# Patient Record
Sex: Male | Born: 1940 | Race: Black or African American | Hispanic: No | Smoking: Never smoker
Health system: Southern US, Community
[De-identification: ages and names within clinical notes are randomized; demographics above are authoritative.]

## PROBLEM LIST (undated history)

## (undated) DIAGNOSIS — I10 Essential (primary) hypertension: Secondary | ICD-10-CM

## (undated) DIAGNOSIS — I2510 Atherosclerotic heart disease of native coronary artery without angina pectoris: Secondary | ICD-10-CM

## (undated) DIAGNOSIS — E119 Type 2 diabetes mellitus without complications: Secondary | ICD-10-CM

## (undated) HISTORY — PX: CARDIAC SURGERY: SHX584

## (undated) HISTORY — PX: CARPAL TUNNEL RELEASE: SHX101

---

## 2009-01-15 DIAGNOSIS — E669 Obesity, unspecified: Secondary | ICD-10-CM | POA: Insufficient documentation

## 2009-01-15 DIAGNOSIS — I2510 Atherosclerotic heart disease of native coronary artery without angina pectoris: Secondary | ICD-10-CM | POA: Insufficient documentation

## 2009-01-15 DIAGNOSIS — E119 Type 2 diabetes mellitus without complications: Secondary | ICD-10-CM | POA: Insufficient documentation

## 2012-09-19 DIAGNOSIS — H40009 Preglaucoma, unspecified, unspecified eye: Secondary | ICD-10-CM | POA: Insufficient documentation

## 2012-09-19 DIAGNOSIS — Z961 Presence of intraocular lens: Secondary | ICD-10-CM | POA: Insufficient documentation

## 2016-02-25 ENCOUNTER — Encounter (HOSPITAL_COMMUNITY): Payer: Self-pay

## 2016-02-25 ENCOUNTER — Emergency Department (HOSPITAL_COMMUNITY): Payer: Medicare PPO

## 2016-02-25 ENCOUNTER — Observation Stay (HOSPITAL_COMMUNITY)
Admission: EM | Admit: 2016-02-25 | Discharge: 2016-02-26 | Disposition: A | Discharge status: Home / Self Care | Payer: Medicare PPO | Attending: Internal Medicine | Admitting: Internal Medicine

## 2016-02-25 DIAGNOSIS — R449 Unspecified symptoms and signs involving general sensations and perceptions: Secondary | ICD-10-CM

## 2016-02-25 DIAGNOSIS — Z794 Long term (current) use of insulin: Secondary | ICD-10-CM | POA: Diagnosis not present

## 2016-02-25 DIAGNOSIS — I10 Essential (primary) hypertension: Secondary | ICD-10-CM | POA: Diagnosis not present

## 2016-02-25 DIAGNOSIS — E785 Hyperlipidemia, unspecified: Secondary | ICD-10-CM

## 2016-02-25 DIAGNOSIS — R200 Anesthesia of skin: Secondary | ICD-10-CM | POA: Diagnosis not present

## 2016-02-25 DIAGNOSIS — I25810 Atherosclerosis of coronary artery bypass graft(s) without angina pectoris: Secondary | ICD-10-CM

## 2016-02-25 DIAGNOSIS — I2510 Atherosclerotic heart disease of native coronary artery without angina pectoris: Secondary | ICD-10-CM | POA: Diagnosis not present

## 2016-02-25 DIAGNOSIS — Z7982 Long term (current) use of aspirin: Secondary | ICD-10-CM | POA: Insufficient documentation

## 2016-02-25 DIAGNOSIS — E1140 Type 2 diabetes mellitus with diabetic neuropathy, unspecified: Secondary | ICD-10-CM

## 2016-02-25 DIAGNOSIS — Z7984 Long term (current) use of oral hypoglycemic drugs: Secondary | ICD-10-CM | POA: Diagnosis not present

## 2016-02-25 DIAGNOSIS — Z79899 Other long term (current) drug therapy: Secondary | ICD-10-CM | POA: Diagnosis not present

## 2016-02-25 DIAGNOSIS — R55 Syncope and collapse: Principal | ICD-10-CM | POA: Insufficient documentation

## 2016-02-25 DIAGNOSIS — Z880 Allergy status to penicillin: Secondary | ICD-10-CM | POA: Insufficient documentation

## 2016-02-25 DIAGNOSIS — Z7902 Long term (current) use of antithrombotics/antiplatelets: Secondary | ICD-10-CM | POA: Diagnosis not present

## 2016-02-25 DIAGNOSIS — R4189 Other symptoms and signs involving cognitive functions and awareness: Secondary | ICD-10-CM | POA: Diagnosis not present

## 2016-02-25 DIAGNOSIS — M109 Gout, unspecified: Secondary | ICD-10-CM

## 2016-02-25 DIAGNOSIS — R42 Dizziness and giddiness: Secondary | ICD-10-CM | POA: Diagnosis not present

## 2016-02-25 DIAGNOSIS — R202 Paresthesia of skin: Secondary | ICD-10-CM | POA: Diagnosis not present

## 2016-02-25 DIAGNOSIS — G459 Transient cerebral ischemic attack, unspecified: Secondary | ICD-10-CM | POA: Diagnosis present

## 2016-02-25 DIAGNOSIS — E119 Type 2 diabetes mellitus without complications: Secondary | ICD-10-CM | POA: Diagnosis not present

## 2016-02-25 HISTORY — DX: Type 2 diabetes mellitus without complications: E11.9

## 2016-02-25 HISTORY — DX: Atherosclerotic heart disease of native coronary artery without angina pectoris: I25.10

## 2016-02-25 HISTORY — DX: Essential (primary) hypertension: I10

## 2016-02-25 LAB — CBC
HEMATOCRIT: 35 % — AB (ref 39.0–52.0)
Hemoglobin: 11.7 g/dL — ABNORMAL LOW (ref 13.0–17.0)
MCH: 31.4 pg (ref 26.0–34.0)
MCHC: 33.4 g/dL (ref 30.0–36.0)
MCV: 93.8 fL (ref 78.0–100.0)
Platelets: 150 10*3/uL (ref 150–400)
RBC: 3.73 MIL/uL — ABNORMAL LOW (ref 4.22–5.81)
RDW: 13.5 % (ref 11.5–15.5)
WBC: 4.4 10*3/uL (ref 4.0–10.5)

## 2016-02-25 LAB — DIFFERENTIAL
BASOS ABS: 0 10*3/uL (ref 0.0–0.1)
BASOS PCT: 0 %
EOS ABS: 0.1 10*3/uL (ref 0.0–0.7)
Eosinophils Relative: 2 %
Lymphocytes Relative: 38 %
Lymphs Abs: 1.7 10*3/uL (ref 0.7–4.0)
MONOS PCT: 6 %
Monocytes Absolute: 0.3 10*3/uL (ref 0.1–1.0)
Neutro Abs: 2.3 10*3/uL (ref 1.7–7.7)
Neutrophils Relative %: 54 %

## 2016-02-25 LAB — COMPREHENSIVE METABOLIC PANEL
ALT: 11 U/L — ABNORMAL LOW (ref 17–63)
ANION GAP: 11 (ref 5–15)
AST: 20 U/L (ref 15–41)
Albumin: 3.6 g/dL (ref 3.5–5.0)
Alkaline Phosphatase: 51 U/L (ref 38–126)
BUN: 11 mg/dL (ref 6–20)
CHLORIDE: 104 mmol/L (ref 101–111)
CO2: 25 mmol/L (ref 22–32)
Calcium: 9.1 mg/dL (ref 8.9–10.3)
Creatinine, Ser: 1.1 mg/dL (ref 0.61–1.24)
Glucose, Bld: 204 mg/dL — ABNORMAL HIGH (ref 65–99)
POTASSIUM: 3.7 mmol/L (ref 3.5–5.1)
SODIUM: 140 mmol/L (ref 135–145)
Total Bilirubin: 0.4 mg/dL (ref 0.3–1.2)
Total Protein: 6.5 g/dL (ref 6.5–8.1)

## 2016-02-25 LAB — CREATININE, SERUM: CREATININE: 0.98 mg/dL (ref 0.61–1.24)

## 2016-02-25 LAB — I-STAT CHEM 8, ED
BUN: 15 mg/dL (ref 6–20)
CHLORIDE: 101 mmol/L (ref 101–111)
CREATININE: 1 mg/dL (ref 0.61–1.24)
Calcium, Ion: 1.13 mmol/L (ref 1.13–1.30)
Glucose, Bld: 195 mg/dL — ABNORMAL HIGH (ref 65–99)
HEMATOCRIT: 36 % — AB (ref 39.0–52.0)
HEMOGLOBIN: 12.2 g/dL — AB (ref 13.0–17.0)
POTASSIUM: 3.7 mmol/L (ref 3.5–5.1)
Sodium: 141 mmol/L (ref 135–145)
TCO2: 28 mmol/L (ref 0–100)

## 2016-02-25 LAB — APTT: APTT: 29 s (ref 24–37)

## 2016-02-25 LAB — PROTIME-INR
INR: 1.07 (ref 0.00–1.49)
Prothrombin Time: 14.1 seconds (ref 11.6–15.2)

## 2016-02-25 LAB — I-STAT TROPONIN, ED: TROPONIN I, POC: 0 ng/mL (ref 0.00–0.08)

## 2016-02-25 LAB — GLUCOSE, CAPILLARY: GLUCOSE-CAPILLARY: 114 mg/dL — AB (ref 65–99)

## 2016-02-25 MED ORDER — INSULIN GLARGINE 100 UNIT/ML ~~LOC~~ SOLN
20.0000 [IU] | Freq: Every day | SUBCUTANEOUS | Status: DC
  Administered 2016-02-26: 20 [IU] via SUBCUTANEOUS
  Filled 2016-02-25: qty 0.2

## 2016-02-25 MED ORDER — ENOXAPARIN SODIUM 40 MG/0.4ML ~~LOC~~ SOLN
40.0000 mg | SUBCUTANEOUS | Status: DC
  Administered 2016-02-25: 40 mg via SUBCUTANEOUS
  Filled 2016-02-25: qty 0.4

## 2016-02-25 MED ORDER — ATORVASTATIN CALCIUM 80 MG PO TABS
80.0000 mg | ORAL_TABLET | Freq: Every day | ORAL | Status: DC
  Administered 2016-02-25: 80 mg via ORAL
  Filled 2016-02-25: qty 1

## 2016-02-25 MED ORDER — ASPIRIN 81 MG PO CHEW
81.0000 mg | CHEWABLE_TABLET | ORAL | Status: DC
  Administered 2016-02-26: 81 mg via ORAL
  Filled 2016-02-25: qty 1

## 2016-02-25 MED ORDER — PHENYLEPH-SHARK LIV OIL-MO-PET 0.25-3-14-71.9 % RE OINT
1.0000 "application " | TOPICAL_OINTMENT | Freq: Every day | RECTAL | Status: DC
  Filled 2016-02-25: qty 28.4

## 2016-02-25 MED ORDER — WITCH HAZEL-GLYCERIN EX PADS
MEDICATED_PAD | CUTANEOUS | Status: DC | PRN
  Filled 2016-02-25: qty 100

## 2016-02-25 MED ORDER — FINASTERIDE 5 MG PO TABS
5.0000 mg | ORAL_TABLET | Freq: Every day | ORAL | Status: DC
  Administered 2016-02-25: 5 mg via ORAL
  Filled 2016-02-25: qty 1

## 2016-02-25 MED ORDER — STROKE: EARLY STAGES OF RECOVERY BOOK
Freq: Once | Status: AC
  Administered 2016-02-25: 23:00:00
  Filled 2016-02-25: qty 1

## 2016-02-25 MED ORDER — INSULIN ASPART 100 UNIT/ML ~~LOC~~ SOLN: 0.0000 [IU] | Freq: Three times a day (TID) | SUBCUTANEOUS | Status: DC

## 2016-02-25 MED ORDER — CLOPIDOGREL BISULFATE 75 MG PO TABS
75.0000 mg | ORAL_TABLET | ORAL | Status: DC
  Administered 2016-02-26: 75 mg via ORAL
  Filled 2016-02-25: qty 1

## 2016-02-25 MED ORDER — DOXAZOSIN MESYLATE 2 MG PO TABS
2.0000 mg | ORAL_TABLET | Freq: Every day | ORAL | Status: DC
  Administered 2016-02-25: 2 mg via ORAL
  Filled 2016-02-25: qty 1

## 2016-02-25 MED ORDER — PANTOPRAZOLE SODIUM 40 MG PO TBEC
40.0000 mg | DELAYED_RELEASE_TABLET | Freq: Two times a day (BID) | ORAL | Status: DC
  Administered 2016-02-25 – 2016-02-26 (×2): 40 mg via ORAL
  Filled 2016-02-25 (×2): qty 1

## 2016-02-25 MED ORDER — INSULIN ASPART 100 UNIT/ML ~~LOC~~ SOLN: 0.0000 [IU] | Freq: Every day | SUBCUTANEOUS | Status: DC

## 2016-02-25 MED ORDER — HYDROCORTISONE 2.5 % RE CREA
TOPICAL_CREAM | Freq: Every day | RECTAL | Status: DC
  Administered 2016-02-25: 1 via RECTAL
  Filled 2016-02-25: qty 28.35

## 2016-02-25 MED ORDER — HYPROMELLOSE (GONIOSCOPIC) 2.5 % OP SOLN: 1.0000 [drp] | Freq: Every day | OPHTHALMIC | Status: DC | PRN

## 2016-02-25 MED ORDER — GABAPENTIN 400 MG PO CAPS
400.0000 mg | ORAL_CAPSULE | Freq: Every day | ORAL | Status: DC
  Administered 2016-02-25: 400 mg via ORAL
  Filled 2016-02-25: qty 1

## 2016-02-25 MED ORDER — ADULT MULTIVITAMIN W/MINERALS CH
1.0000 | ORAL_TABLET | Freq: Every day | ORAL | Status: DC
  Administered 2016-02-25 – 2016-02-26 (×2): 1 via ORAL
  Filled 2016-02-25 (×2): qty 1

## 2016-02-25 MED ORDER — TRAMADOL HCL 50 MG PO TABS
25.0000 mg | ORAL_TABLET | Freq: Every evening | ORAL | Status: DC | PRN
  Administered 2016-02-25 – 2016-02-26 (×2): 25 mg via ORAL
  Filled 2016-02-25 (×2): qty 1

## 2016-02-25 MED ORDER — ALLOPURINOL 300 MG PO TABS
300.0000 mg | ORAL_TABLET | Freq: Every day | ORAL | Status: DC
  Administered 2016-02-26: 300 mg via ORAL
  Filled 2016-02-25: qty 1

## 2016-02-25 NOTE — ED Notes (Signed)
Pt transported to CT and then MRI.

## 2016-02-25 NOTE — ED Notes (Signed)
Attempted to call report

## 2016-02-25 NOTE — ED Notes (Signed)
Family at bedside. 

## 2016-02-25 NOTE — ED Notes (Signed)
Pt. Back from MRI scan.

## 2016-02-25 NOTE — ED Provider Notes (Signed)
CSN: [id number]     Arrival date & time 02/25/16  1601 History   First MD Initiated Contact with Patient 02/25/16 1620     Chief Complaint  Patient presents with  . Code Stroke     (Consider location/radiation/quality/duration/timing/severity/associated sxs/prior Treatment) HPI 75 y.o. male with history of CAD status post CABG in 1996, diabetes and HTN presents to the emergency department after he had a brief episode of feeling flushed and lightheaded after he stepped out of a car up onto the sidewalk going to visit a friend. The patient then returned home where he was seen to have findings concerning for left-sided facial droop as well as subtly slurred speech the patient remained alert and oriented throughout. EMS was called and neuro exam at that time was reassuring with no facial droop noted. He presented to the ED as a code stroke and was seen concomitently with neurology on arrival.   Past Medical History  Diagnosis Date  . Diabetes mellitus without complication (HCC)   . Hypertension   . Coronary artery disease    Past Surgical History  Procedure Laterality Date  . Cardiac surgery      CABG 1996  . Carpal tunnel release     History reviewed. No pertinent family history. Social History  Substance Use Topics  . Smoking status: Never Smoker   . Smokeless tobacco: None  . Alcohol Use: Yes     Comment: occasional     Review of Systems  Constitutional: Positive for diaphoresis and activity change. Negative for fever and chills.  HENT: Negative for congestion, sinus pressure and sneezing.   Respiratory: Negative for cough, chest tightness and shortness of breath.   Cardiovascular: Negative for chest pain, palpitations and leg swelling.  Gastrointestinal: Negative for nausea, vomiting and abdominal pain.  Genitourinary: Negative for dysuria and difficulty urinating.  Musculoskeletal: Negative for back pain and neck pain.  Skin: Negative for rash.  Neurological: Positive for  dizziness, facial asymmetry, speech difficulty, light-headedness and numbness. Negative for syncope and weakness.  All other systems reviewed and are negative.     Allergies  Lisinopril; Penicillins; Pseudoephedrine hcl; Dimetapp cold-allergy; and Triprolidine-pseudoephedrine  Home Medications   Prior to Admission medications   Medication Sig Start Date End Date Taking? Authorizing Provider  acetaminophen (TYLENOL) 325 MG tablet Take 650 mg by mouth daily as needed.   Yes Historical Provider, MD  aspirin 81 MG chewable tablet Chew 81 mg by mouth every morning.   Yes Historical Provider, MD  atorvastatin (LIPITOR) 80 MG tablet Take 80 mg by mouth at bedtime.   Yes Historical Provider, MD  clopidogrel (PLAVIX) 75 MG tablet Take 75 mg by mouth every morning.   Yes Historical Provider, MD  doxazosin (CARDURA) 2 MG tablet Take 2 mg by mouth at bedtime.   Yes Historical Provider, MD  finasteride (PROSCAR) 5 MG tablet Take 5 mg by mouth at bedtime.   Yes Historical Provider, MD  furosemide (LASIX) 20 MG tablet Take 20 mg by mouth 2 (two) times daily.   Yes Historical Provider, MD  gabapentin (NEURONTIN) 100 MG capsule Take 400 mg by mouth at bedtime.   Yes Historical Provider, MD  Hypromellose (ARTIFICIAL TEARS) 0.4 % SOLN Place 1 drop into both eyes daily as needed (for dry eyes).  05/09/09  Yes Historical Provider, MD  isosorbide mononitrate (IMDUR) 30 MG 24 hr tablet Take 90 mg by mouth every morning.   Yes Historical Provider, MD  lidocaine (LIDODERM) 5 % Place 1  patch onto the skin at bedtime.   Yes Historical Provider, MD  magnesium oxide (MAG-OX) 400 MG tablet Take 800 mg by mouth every morning.   Yes Historical Provider, MD  metFORMIN (GLUCOPHAGE) 1000 MG tablet Take 1,000 mg by mouth 2 (two) times daily.   Yes Historical Provider, MD  metoprolol (LOPRESSOR) 50 MG tablet Take 50 mg by mouth 2 (two) times daily.   Yes Historical Provider, MD  Multiple Vitamins-Minerals (MULTIVITAMIN WITH  MINERALS) tablet Take 1 tablet by mouth. 06/16/09  Yes Historical Provider, MD  nitroGLYCERIN (NITROSTAT) 0.4 MG SL tablet Place 0.4 mg under the tongue every 5 (five) minutes as needed for chest pain.    Yes Historical Provider, MD  pantoprazole (PROTONIX) 40 MG tablet Take 40 mg by mouth 2 (two) times daily.   Yes Historical Provider, MD  phenylephrine-shark liver oil-mineral oil-petrolatum (PREPARATION H) 0.25-3-14-71.9 % rectal ointment Place 1 application rectally at bedtime.   Yes Historical Provider, MD  traMADol (ULTRAM) 50 MG tablet Take 25 mg by mouth at bedtime as needed for moderate pain.    Yes Historical Provider, MD   BP 120/69 mmHg  Pulse 80  Temp(Src) 98.4 F (36.9 C) (Oral)  Resp 15  Ht 6' (1.829 m)  Wt 112.084 kg  BMI 33.51 kg/m2  SpO2 97% Physical Exam  Constitutional: He is oriented to person, place, and time. He appears well-developed and well-nourished. No distress.  HENT:  Head: Normocephalic and atraumatic.  Nose: Nose normal.  Mouth/Throat: Oropharynx is clear and moist.  Eyes: Conjunctivae and EOM are normal. Pupils are equal, round, and reactive to light.  Neck: Neck supple.  Cardiovascular: Normal rate, regular rhythm, normal heart sounds and intact distal pulses.   Pulmonary/Chest: Effort normal and breath sounds normal.  Abdominal: Soft. He exhibits no distension. There is no tenderness.  Musculoskeletal: He exhibits no edema or tenderness.  Neurological: He is alert and oriented to person, place, and time. He has normal strength. A sensory deficit (subtle sensory deficit noted by patient in left UE and left face) is present. No cranial nerve deficit. Coordination normal. GCS eye subscore is 4. GCS verbal subscore is 5. GCS motor subscore is 6.  5/5 strength in all 4 extremities  Skin: Skin is warm and dry. No rash noted. He is not diaphoretic.  Nursing note and vitals reviewed.   ED Course  Procedures (including critical care time) Labs Review Labs  Reviewed  CBC - Abnormal; Notable for the following:    RBC 3.73 (*)    Hemoglobin 11.7 (*)    HCT 35.0 (*)    All other components within normal limits  COMPREHENSIVE METABOLIC PANEL - Abnormal; Notable for the following:    Glucose, Bld 204 (*)    ALT 11 (*)    All other components within normal limits  GLUCOSE, CAPILLARY - Abnormal; Notable for the following:    Glucose-Capillary 114 (*)    All other components within normal limits  GLUCOSE, CAPILLARY - Abnormal; Notable for the following:    Glucose-Capillary 155 (*)    All other components within normal limits  I-STAT CHEM 8, ED - Abnormal; Notable for the following:    Glucose, Bld 195 (*)    Hemoglobin 12.2 (*)    HCT 36.0 (*)    All other components within normal limits  PROTIME-INR  APTT  DIFFERENTIAL  CREATININE, SERUM  GLUCOSE, CAPILLARY  I-STAT TROPOININ, ED    Imaging Review Ct Head Wo Contrast  02/25/2016  CLINICAL DATA:  75 year old male with history of lightheadedness and left-sided facial droop. Low blood pressure. Code stroke. EXAM: CT HEAD WITHOUT CONTRAST TECHNIQUE: Contiguous axial images were obtained from the base of the skull through the vertex without intravenous contrast. COMPARISON:  No priors. FINDINGS: Mild cerebral atrophy. Patchy and confluent areas of decreased attenuation are noted throughout the deep and periventricular white matter of the cerebral hemispheres bilaterally, compatible with chronic microvascular ischemic disease. No acute intracranial abnormalities. Specifically, no evidence of acute intracranial hemorrhage, no definite findings of acute/subacute cerebral ischemia, no mass, mass effect, hydrocephalus or abnormal intra or extra-axial fluid collections. Visualized paranasal sinuses and mastoids are well pneumatized, with exception of some tiny polyps in the right maxillary sinus. Prior medial right orbital wall fracture, with distortion of several right-sided ethmoid sinuses. No acute  displaced skull fractures are identified. IMPRESSION: 1. No acute intracranial abnormalities. 2. Mild cerebral atrophy with chronic microvascular ischemic changes in cerebral white matter, as above. These results were called by telephone at the time of interpretation on 02/25/2016 at 4:44 pm to Dr. Amada Jupiter, who verbally acknowledged these results. Electronically Signed   By: Trudie Reed M.D.   On: 02/25/2016 16:45   Ct Chest Wo Contrast  02/25/2016  CLINICAL DATA:  History of heart surgery.  Needs clearance for MRI. EXAM: CT CHEST WITHOUT CONTRAST TECHNIQUE: Multidetector CT imaging of the chest was performed following the standard protocol without IV contrast. COMPARISON:  None. FINDINGS: Mediastinum/Lymph Nodes: Previous median sternotomy and CABG procedure. The normal heart size. The trachea appears patent and is midline. Normal appearance of the esophagus. No mediastinal or hilar adenopathy. Lungs/Pleura: No pleural effusion. No airspace consolidation or atelectasis. No suspicious pulmonary nodule or mass. Upper abdomen: No acute findings. Musculoskeletal: Spondylosis is present within the thoracic spine. No aggressive lytic or sclerotic bone lesions. IMPRESSION: 1. No acute cardiopulmonary abnormalities. 2. Previous CABG procedure.  No pacer device identified. Electronically Signed   By: Signa Kell M.D.   On: 02/25/2016 18:54   Mr Brain Wo Contrast  02/25/2016  CLINICAL DATA:  Left-sided facial droop and speech disturbance. EXAM: MRI HEAD WITHOUT CONTRAST TECHNIQUE: Multiplanar, multiecho pulse sequences of the brain and surrounding structures were obtained without intravenous contrast. COMPARISON:  Head CT 02/25/2016 FINDINGS: There is no evidence of acute infarct, intracranial hemorrhage, mass, midline shift, or extra-axial fluid collection. There is mild generalized cerebral atrophy. Foci of T2 hyperintensity in the subcortical and periventricular cerebral white matter bilaterally are  nonspecific but compatible with mild-to-moderate chronic small vessel ischemic disease. 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 Prior right cataract extraction is noted. Depression of the right lamina papyracea is suggestive of an old medial orbital blowout fracture. Small right maxillary sinus mucous retention cysts are noted. The mastoid air cells are clear. Major intracranial vascular flow voids are preserved. IMPRESSION: 1. No acute intracranial abnormality. 2. Mild-to-moderate chronic small vessel ischemic disease. Electronically Signed   By: Sebastian Ache M.D.   On: 02/25/2016 19:31   I have personally reviewed and evaluated these images and lab results as part of my medical decision-making.   EKG Interpretation   Date/Time:  Saturday February 25 2016 16:34:43 EDT Ventricular Rate:  74 PR Interval:  154 QRS Duration: 153 QT Interval:  464 QTC Calculation: 515 R Axis:   -39 Text Interpretation:  Sinus rhythm Right bundle branch block Confirmed by  Fayrene Fearing  MD, MARK ([id number]) on 02/26/2016 5:27:45 PM      MDM  75 y.o.  male with a hx of CAD but no hx of CVA presents to the ED after he had an episode of near-syncope accompanied by a new onset left sided sensory deficit. Physical exam finds him with persistent subjective slight sensory deficit in his left upper extremity and left face, but normal strength, GCS 15. CT head and chest was done and showed no acute abnormality. MRI was recommended by neurology. EKG was reassuring with NSR with RBBB similar to prior with no acute ischemic changes. Labs were reassuring with normal troponin and electrolytes, mildly elevated hg 11.7. MRI resulted showing no acute intracranial abnormality. Given the patient's near syncope with hx of CAD, with new neurologic sx that occurred suddenly and transiently feel that the patient would benefit from observation admission for further assessment and care. This was discussed with the patient at the bedside and he  stated both understanding and agreement with this plan.   Final diagnoses:  Near syncope  Left-sided sensory deficit present        Francoise Ceo, DO 02/26/16 2219  Blane Ohara, MD 02/27/16 [id number]

## 2016-02-25 NOTE — ED Notes (Signed)
[facility]- pt was at meeting and stepped outside, became very hot, dizzy and hypotensive. Per others on scene pt's speech changed and pt has some left side facial droop. Vitals stable. Pt alert and oriented.

## 2016-02-25 NOTE — Consult Note (Signed)
Neurology Consultation Reason for Consult: Change in speech Referring Physician: Jodi Mourning  CC: Change in speech  History is obtained from: Patient  HPI: Jerry Wallace is a 75 y.o. male who was normal earlier today, then had episode of slurred speech 11:30 AM. He states that he became quite lightheaded, and then noticed that his speech did not seem quite normal. He was brought into the emergency room as a code stroke, however had proved by the time of arrival. He was found to be 80s systolic on arrival.  Of note, he has some left-sided numbness on exam, but the patient had not noticed this prior to my assessment and therefore it isn't possible to know when this was sent to max restarted.  LKW: 11:30 AM tpa given?: no, outside of window    ROS: A 14 point ROS was performed and is negative except as noted in the HPI.   Past Medical History  Diagnosis Date  . Diabetes mellitus without complication (HCC)   . Hypertension   . Coronary artery disease      Family history: No history of similar   Social History:  reports that he has never smoked. He does not have any smokeless tobacco history on file. He reports that he drinks alcohol. His drug history is not on file.   Exam: Current vital signs: BP 133/79 mmHg  Pulse 69  Temp(Src) 98.1 F (36.7 C) (Oral)  Resp 16  Ht  (1.854 m)  Wt 117.073 kg (258 lb 1.6 oz)  BMI 34.06 kg/m2  SpO2 98% Vital signs in last 24 hours: Temp:  [98.1 F (36.7 C)] 98.1 F (36.7 C) (04/29 1626) Pulse Rate:  [66-77] 69 (04/29 2045) Resp:  [12-22] 16 (04/29 2015) BP: (111-133)/(64-79) 133/79 mmHg (04/29 2045) SpO2:  [93 %-100 %] 98 % (04/29 2045) Weight:  [117.073 kg (258 lb 1.6 oz)] 117.073 kg (258 lb 1.6 oz) (04/29 1626)   Physical Exam  Constitutional: Appears well-developed and well-nourished.  Psych: Affect appropriate to situation Eyes: No scleral injection HENT: No OP obstrucion Head: Normocephalic.  Cardiovascular: Normal rate  and regular rhythm.  Respiratory: Effort normal and breath sounds normal to anterior ascultation GI: Soft.  No distension. There is no tenderness.  Skin: WDI  Neuro: Mental Status: Patient is awake, alert, oriented to person, place, month, year, and situation. Patient is able to give a clear and coherent history. No signs of aphasia or neglect Cranial Nerves: II: Visual Fields are full. Pupils are equal, round, and reactive to light.   III,IV, VI: EOMI without ptosis or diploplia.  V: Facial sensation is decreased on the left to temperature VII: Facial movement is possible mildly decreased movement of the left face, but this is not clear smile is relatively symmetric. VIII: hearing is intact to voice X: Uvula elevates symmetrically XI: Shoulder shrug is symmetric. XII: tongue is midline without atrophy or fasciculations.  Motor: Tone is normal. Bulk is normal. 5/5 strength was present in all four extremities.  Sensory: Sensation is decreased on the left to temperature Cerebellar:   I have reviewed labs in epic and the results pertinent to this consultation are: CMP-unremarkable  I have reviewed the images obtained: CT head-unremarkable  Impression: 75 year old male with transient lightheadedness/slurred speech in the setting of hypotension. Given that the symptoms sound concerning for possible presyncope, and there was documented hypotension I suspect that this is the etiology. It is impossible to know if his left-sided numbness is truly new or not given that  the patient did not notice it until I pointed it out. I would obtain an MRI of his brain, and if it is negative then I think that I would pursue workup for syncope rather than stroke.  Recommendations: 1) MRI brain 2) if negative, then no further recommendations from neurology and would pursue a presyncopal workup per internal medicine.   Ritta SlotMcNeill Cambre Matson, MD [facility] [phone]  If 7pm- 7am, please  page neurology on call as listed in AMION.

## 2016-02-25 NOTE — ED Notes (Signed)
Patient still in MRI.  

## 2016-02-25 NOTE — H&P (Signed)
Date: 02/25/2016               Patient Name:  Jerry Wallace MRN: [id number]  DOB: 09/07/1941 Age / Sex: 75 y.o., male   PCP: Pcp Not In System         Medical Service: Internal Medicine Teaching Service         Attending Physician: Dr. Blane OharaJoshua Zavitz, MD    First Contact: Dr. Selina CooleyKyle Flores Pager: [phone]  Second Contact: Dr. Heywood Ilesushil Patel Pager: [phone]       After Hours (After 5p/  First Contact Pager: [phone]  weekends / holidays): Second Contact Pager: [phone]   Chief Complaint: Lightheadedness, slurred speech, and left-sided numbness  History of Present Illness: Mr. Vear Clockhillips is a 75 year old never smoker with CAD S/P CABG in 1995 and 3 stents placed in Jan and Oct 2016, HTN, T2DM, BPH, and gout who presents with lightheadedness, slurred speech and left-sided numbness that started earlier today. He was driving around 1 PM this afternoon, when he started to feel lightheaded with a "flushed feeling", blurred vision, and slurred speech. He had no symptoms prior to this. This prompted him to drive around and head back to the hotel where he was staying. Hotel staff there noticed that he continued to feel lightheaded with slurred speech. A medical professional there noted his blood pressure to be in the 120s systolic, CBG in the 200s, with left facial arm and leg numbness. EMS was called and he was brought here. He denies this ever happening before. He denies headache, loss of consciousness, falls, diaphoresis, chest pain, shortness of breath, palpitations, nausea, vomiting, diarrhea, any changes in urination, any focal weakness, or any other symptoms at this time.  Meds: No current facility-administered medications for this encounter.   Current Outpatient Prescriptions  Medication Sig Dispense Refill  . aspirin 81 MG chewable tablet Chew 81 mg by mouth every morning.    Marland Kitchen. atorvastatin (LIPITOR) 80 MG tablet Take 80 mg by mouth at bedtime.    . clopidogrel (PLAVIX) 75 MG tablet Take 75 mg  by mouth every morning.    Marland Kitchen. doxazosin (CARDURA) 2 MG tablet Take 2 mg by mouth at bedtime.    . furosemide (LASIX) 20 MG tablet Take 20 mg by mouth 2 (two) times daily.    Marland Kitchen. lidocaine (LIDODERM) 5 % Place 1 patch onto the skin at bedtime.    . metFORMIN (GLUCOPHAGE) 1000 MG tablet Take 1,000 mg by mouth 2 (two) times daily.    . phenylephrine-shark liver oil-mineral oil-petrolatum (PREPARATION H) 0.25-3-14-71.9 % rectal ointment Place 1 application rectally at bedtime.      Allergies: Allergies as of 02/25/2016 - Review Complete 02/25/2016  Allergen Reaction Noted  . Lisinopril  02/25/2016  . Penicillins Swelling 02/25/2016  . Pseudoephedrine hcl  02/25/2016  . Dimetapp cold-allergy [brompheniramine-phenylephrine] Rash 02/25/2016  . Triprolidine-pseudoephedrine Rash 02/25/2016   Past Medical History  Diagnosis Date  . Diabetes mellitus without complication (HCC)   . Hypertension   . Coronary artery disease    Past Surgical History  Procedure Laterality Date  . Cardiac surgery      CABG 1996  . Carpal tunnel release     History reviewed. No pertinent family history. Social History   Social History  . Marital Status: Widowed    Spouse Name: N/A  . Number of Children: N/A  . Years of Education: N/A   Occupational History  . Not on file.   Social History Main Topics  .  Smoking status: Never Smoker   . Smokeless tobacco: Not on file  . Alcohol Use: Yes     Comment: occasional   . Drug Use: Not on file  . Sexual Activity: Not on file   Other Topics Concern  . Not on file   Social History Narrative  . No narrative on file   Review of Systems: Pertinent items noted in HPI and remainder of comprehensive ROS otherwise negative.  Physical Exam: Blood pressure 114/65, pulse 72, temperature 98.1 F (36.7 C), temperature source Oral, resp. rate 13, height  (1.854 m), weight 258 lb 1.6 oz (117.073 kg), SpO2 98 %.   Gen: Well-appearing, alert and oriented to person,  place, and time HEENT: Oropharynx clear without erythema or exudate.  Neck: No cervical LAD, no thyromegaly or nodules, no JVD noted. CV: Normal rate, regular rhythm, no murmurs, rubs, or gallops. Loud S2 heard best at LSB. Pulmonary: Normal effort, CTA bilaterally, no crackles or wheezes Abdominal: Soft, non-tender, non-distended, without rebound, guarding, or masses Extremities: Distal pulses 2+ in upper and lower extremities bilaterally, no tenderness, erythema or edema Neuro: CN II-XII grossly intact, except for weakness to fine touch in the left V2 and V3 distribution. Decreased fine touch sensation in the left arm and left leg. Normal sensation on the right throughout. 5/ 5 strength in the upper and lower extremities symmetrically. No finger to nose, heel to shin dysmetria or dysdiadochokinesia.  Lab results: Basic Metabolic Panel:  Recent Labs  16/10/96 1604 02/25/16 1619  NA 140 141  K 3.7 3.7  CL 104 101  CO2 25  --   GLUCOSE 204* 195*  BUN 11 15  CREATININE 1.10 1.00  CALCIUM 9.1  --    Liver Function Tests:  Recent Labs  02/25/16 1604  AST 20  ALT 11*  ALKPHOS 51  BILITOT 0.4  PROT 6.5  ALBUMIN 3.6   CBC:  Recent Labs  02/25/16 1604 02/25/16 1619  WBC 4.4  --   NEUTROABS 2.3  --   HGB 11.7* 12.2*  HCT 35.0* 36.0*  MCV 93.8  --   PLT 150  --    Imaging results:  Ct Head Wo Contrast  02/25/2016  CLINICAL DATA:  75 year old male with history of lightheadedness and left-sided facial droop. Low blood pressure. Code stroke. EXAM: CT HEAD WITHOUT CONTRAST TECHNIQUE: Contiguous axial images were obtained from the base of the skull through the vertex without intravenous contrast. COMPARISON:  No priors. FINDINGS: Mild cerebral atrophy. Patchy and confluent areas of decreased attenuation are noted throughout the deep and periventricular white matter of the cerebral hemispheres bilaterally, compatible with chronic microvascular ischemic disease. No acute  intracranial abnormalities. Specifically, no evidence of acute intracranial hemorrhage, no definite findings of acute/subacute cerebral ischemia, no mass, mass effect, hydrocephalus or abnormal intra or extra-axial fluid collections. Visualized paranasal sinuses and mastoids are well pneumatized, with exception of some tiny polyps in the right maxillary sinus. Prior medial right orbital wall fracture, with distortion of several right-sided ethmoid sinuses. No acute displaced skull fractures are identified. IMPRESSION: 1. No acute intracranial abnormalities. 2. Mild cerebral atrophy with chronic microvascular ischemic changes in cerebral white matter, as above. These results were called by telephone at the time of interpretation on 02/25/2016 at 4:44 pm to Dr. Amada Jupiter, who verbally acknowledged these results. Electronically Signed   By: Trudie Reed M.D.   On: 02/25/2016 16:45   Ct Chest Wo Contrast  02/25/2016  CLINICAL DATA:  History of heart  surgery.  Needs clearance for MRI. EXAM: CT CHEST WITHOUT CONTRAST TECHNIQUE: Multidetector CT imaging of the chest was performed following the standard protocol without IV contrast. COMPARISON:  None. FINDINGS: Mediastinum/Lymph Nodes: Previous median sternotomy and CABG procedure. The normal heart size. The trachea appears patent and is midline. Normal appearance of the esophagus. No mediastinal or hilar adenopathy. Lungs/Pleura: No pleural effusion. No airspace consolidation or atelectasis. No suspicious pulmonary nodule or mass. Upper abdomen: No acute findings. Musculoskeletal: Spondylosis is present within the thoracic spine. No aggressive lytic or sclerotic bone lesions. IMPRESSION: 1. No acute cardiopulmonary abnormalities. 2. Previous CABG procedure.  No pacer device identified. Electronically Signed   By: Signa Kell M.D.   On: 02/25/2016 18:54   Mr Brain Wo Contrast  02/25/2016  CLINICAL DATA:  Left-sided facial droop and speech disturbance. EXAM:  MRI HEAD WITHOUT CONTRAST TECHNIQUE: Multiplanar, multiecho pulse sequences of the brain and surrounding structures were obtained without intravenous contrast. COMPARISON:  Head CT 02/25/2016 FINDINGS: There is no evidence of acute infarct, intracranial hemorrhage, mass, midline shift, or extra-axial fluid collection. There is mild generalized cerebral atrophy. Foci of T2 hyperintensity in the subcortical and periventricular cerebral white matter bilaterally are nonspecific but compatible with mild-to-moderate chronic small vessel ischemic disease. 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 Prior right cataract extraction is noted. Depression of the right lamina papyracea is suggestive of an old medial orbital blowout fracture. Small right maxillary sinus mucous retention cysts are noted. The mastoid air cells are clear. Major intracranial vascular flow voids are preserved. IMPRESSION: 1. No acute intracranial abnormality. 2. Mild-to-moderate chronic small vessel ischemic disease. Electronically Signed   By: Sebastian Ache M.D.   On: 02/25/2016 19:31   Other results: EKG: normal sinus rhythm, RBBB.  Assessment & Plan by Problem: 1. Left body numbness - sudden onset lightheadedness, flushing, blurred vision and slurred speech with associated numbness over the left face, arm, and leg. CT with mild cerebral atrophy and chronic microvascular changes but no acute changes. MRI also negative for acute intracranial pathology. EKG normal sinus with right bundle. No other associated symptoms to suggest a cardiovascular etiology despite his extensive cardiac history but with flushing and lightheadedness, there may be a component of dehydration and heat along with typical CVA-like symptoms. Patient was not hypoglycemic. Most likely an acute CVA despite the absence of neuroimaging findings, with possible dehydration component -Consult neurology; greatly appreciated thoughtful care of this mutual patient -Consider  carotid US or angiography, TTE -Permissive hypertension although patient is not hypertensive currently -Continue home antiplatelet and statin therapy as well as aggressive risk factor management. Already on clopidogrel and aspirin as well as atorvastatin 80 -SLP, PT, OT recs -Repeat EKG in the a.m. -Telemetry  2.T2DM - per patient, last A1c was 9 roughly 1 week ago. On metformin 1000 mg twice a day and Lantus 60 units every morning. Usually runs in the 200s, and was not hypoglycemic today. No recent changes to his insulin regimen -SSI -Continue home gabapentin for neuropathy, tramadol PRN  3. HTN -Continue home doxazosin, hold metoprolol  PPX DVT-enoxaparin  Dispo: Disposition is deferred at this time, awaiting improvement of current medical problems. Anticipated discharge in approximately 1-2 day(s).   The patient does have a current PCP [facility]) and does need an [facility] hospital follow-up appointment after discharge.  The patient does not have transportation limitations that hinder transportation to clinic appointments.  Signed: Darrick Huntsman, MD 02/25/2016, 8:04 PM

## 2016-02-26 ENCOUNTER — Observation Stay (HOSPITAL_BASED_OUTPATIENT_CLINIC_OR_DEPARTMENT_OTHER): Payer: Medicare PPO

## 2016-02-26 ENCOUNTER — Observation Stay (HOSPITAL_COMMUNITY): Payer: Medicare PPO

## 2016-02-26 DIAGNOSIS — G459 Transient cerebral ischemic attack, unspecified: Secondary | ICD-10-CM | POA: Diagnosis not present

## 2016-02-26 DIAGNOSIS — R4189 Other symptoms and signs involving cognitive functions and awareness: Secondary | ICD-10-CM

## 2016-02-26 DIAGNOSIS — R449 Unspecified symptoms and signs involving general sensations and perceptions: Secondary | ICD-10-CM | POA: Insufficient documentation

## 2016-02-26 DIAGNOSIS — R200 Anesthesia of skin: Secondary | ICD-10-CM | POA: Diagnosis not present

## 2016-02-26 LAB — GLUCOSE, CAPILLARY
GLUCOSE-CAPILLARY: 75 mg/dL (ref 65–99)
GLUCOSE-CAPILLARY: 155 mg/dL — AB (ref 65–99)

## 2016-02-26 NOTE — Progress Notes (Signed)
Patient ID: Jerry Wallace, male   DOB: 11/22/1940, 75 y.o.   MRN: [id number]   Subjective: Jerry Wallace still has some numbness on the left side of his entire body but he was unaware of this before the neurologist pointed it out. His pre-syncope has resolved and he is feeling his normal self. He denies any weakness.  Objective: Vital signs in last 24 hours: Filed Vitals:   02/26/16 0600 02/26/16 0751 02/26/16 0800 02/26/16 0950  BP: 114/68  120/69   Pulse: 77  80   Temp:  98.4 F (36.9 C)  98.4 F (36.9 C)  TempSrc:  Oral  Oral  Resp: 24  15   Height:      Weight:      SpO2: 97%  97%    General: friendly black man resting in bed comfortably, appropriately conversational HEENT: no scleral icterus, extra-ocular muscles intact, oropharynx without lesions Cardiac: regular rate and rhythm, S3 present Pulm: breathing well, subtle bibasilar crackles Abd: bowel sounds normal, soft, nondistended, non-tender Ext: warm and well perfused, without pedal edema Lymph: no cervical or supraclavicular lymphadenopathy Skin: no rash, hair, or nail changes Neuro: alert and oriented X3, cranial nerves II-XII grossly intact, decreased sensation to light touch on left face and left side of entire body, moving all extremities well, strength 5/5 throughout  Medications: I have reviewed the patient's current medications. Scheduled Meds: . allopurinol  300 mg Oral Daily  . aspirin  81 mg Oral BH-q7a  . atorvastatin  80 mg Oral QHS  . clopidogrel  75 mg Oral BH-q7a  . doxazosin  2 mg Oral QHS  . enoxaparin (LOVENOX) injection  40 mg Subcutaneous Q24H  . finasteride  5 mg Oral QHS  . gabapentin  400 mg Oral QHS  . hydrocortisone   Rectal QHS  . insulin aspart  0-5 Units Subcutaneous QHS  . insulin aspart  0-9 Units Subcutaneous TID WC  . insulin glargine  20 Units Subcutaneous Daily  . multivitamin with minerals  1 tablet Oral Daily  . pantoprazole  40 mg Oral BID  . phenylephrine-shark liver  oil-mineral oil-petrolatum  1 application Rectal QHS   Continuous Infusions:  PRN Meds:.hydroxypropyl methylcellulose / hypromellose, traMADol, witch hazel-glycerin   Assessment/Plan:  Left-sided numbness: MRI is normal so stroke has been ruled out. He's already on DAPT and high-intensity stain anyway. Neurology feels he is clear for discharge and I agree. -Discharge home on home medications  T2DM: Blood sugars look fine.  Dispo: Disposition is deferred at this time, awaiting improvement of current medical problems.  Anticipated discharge in approximately 0 day(s).   The patient does have a current PCP (Pcp Not In System) and does [facility] after discharge.  The patient does have transportation limitations that hinder transportation to clinic appointments.  .Services Needed at time of discharge: Y = Yes, Blank = No PT:   OT:   RN:   Equipment:   Other:       Jerry CooleyKyle Enza Shone, MD 02/26/2016, 11:05 AM

## 2016-02-26 NOTE — Discharge Summary (Signed)
Name: Jerry Wallace MRN: [id number] DOB: 1941-03-24 75 y.o. PCP: Pcp Not In System  Date of Admission: 02/25/2016  4:02 PM Date of Discharge: 02/26/2016 Attending Physician: Jerry Lagos, MD  Discharge Diagnosis: 1. Left-sided numbness  Discharge Medications:   Medication List    TAKE these medications        acetaminophen 325 MG tablet  Commonly known as:  TYLENOL  Take 650 mg by mouth daily as needed.     ARTIFICIAL TEARS 0.4 % Soln  Generic drug:  Hypromellose  Place 1 drop into both eyes daily as needed (for dry eyes).     aspirin 81 MG chewable tablet  Chew 81 mg by mouth every morning.     atorvastatin 80 MG tablet  Commonly known as:  LIPITOR  Take 80 mg by mouth at bedtime.     clopidogrel 75 MG tablet  Commonly known as:  PLAVIX  Take 75 mg by mouth every morning.     doxazosin 2 MG tablet  Commonly known as:  CARDURA  Take 2 mg by mouth at bedtime.     finasteride 5 MG tablet  Commonly known as:  PROSCAR  Take 5 mg by mouth at bedtime.     furosemide 20 MG tablet  Commonly known as:  LASIX  Take 20 mg by mouth 2 (two) times daily.     gabapentin 100 MG capsule  Commonly known as:  NEURONTIN  Take 400 mg by mouth at bedtime.     isosorbide mononitrate 30 MG 24 hr tablet  Commonly known as:  IMDUR  Take 90 mg by mouth every morning.     lidocaine 5 %  Commonly known as:  LIDODERM  Place 1 patch onto the skin at bedtime.     magnesium oxide 400 MG tablet  Commonly known as:  MAG-OX  Take 800 mg by mouth every morning.     metFORMIN 1000 MG tablet  Commonly known as:  GLUCOPHAGE  Take 1,000 mg by mouth 2 (two) times daily.     metoprolol 50 MG tablet  Commonly known as:  LOPRESSOR  Take 50 mg by mouth 2 (two) times daily.     multivitamin with minerals tablet  Take 1 tablet by mouth.     nitroGLYCERIN 0.4 MG SL tablet  Commonly known as:  NITROSTAT  Place 0.4 mg under the tongue every 5 (five) minutes as needed for chest pain.       pantoprazole 40 MG tablet  Commonly known as:  PROTONIX  Take 40 mg by mouth 2 (two) times daily.     phenylephrine-shark liver oil-mineral oil-petrolatum 0.25-3-14-71.9 % rectal ointment  Commonly known as:  PREPARATION H  Place 1 application rectally at bedtime.     traMADol 50 MG tablet  Commonly known as:  ULTRAM  Take 25 mg by mouth at bedtime as needed for moderate pain.        Disposition and follow-up:   [name] was discharged from [facility] in Good condition.  At the hospital follow up visit please address:  1.  Resolution of his left-sided numbness  2. Follow-up his right knee pain  Discharge Instructions:     Discharge Instructions    Diet - low sodium heart healthy    Complete by:  As directed      Increase activity slowly    Complete by:  As directed            Consultations:  Procedures Performed:  Ct Head Wo Contrast  02/25/2016  CLINICAL DATA:  75 year old male with history of lightheadedness and left-sided facial droop. Low blood pressure. Code stroke. EXAM: CT HEAD WITHOUT CONTRAST TECHNIQUE: Contiguous axial images were obtained from the base of the skull through the vertex without intravenous contrast. COMPARISON:  No priors. FINDINGS: Mild cerebral atrophy. Patchy and confluent areas of decreased attenuation are noted throughout the deep and periventricular white matter of the cerebral hemispheres bilaterally, compatible with chronic microvascular ischemic disease. No acute intracranial abnormalities. Specifically, no evidence of acute intracranial hemorrhage, no definite findings of acute/subacute cerebral ischemia, no mass, mass effect, hydrocephalus or abnormal intra or extra-axial fluid collections. Visualized paranasal sinuses and mastoids are well pneumatized, with exception of some tiny polyps in the right maxillary sinus. Prior medial right orbital wall fracture, with distortion of several right-sided ethmoid  sinuses. No acute displaced skull fractures are identified. IMPRESSION: 1. No acute intracranial abnormalities. 2. Mild cerebral atrophy with chronic microvascular ischemic changes in cerebral white matter, as above. These results were called by telephone at the time of interpretation on 02/25/2016 at 4:44 pm to Dr. Amada Wallace, who verbally acknowledged these results. Electronically Signed   By: Jerry Reedaniel  Wallace M.D.   On: 02/25/2016 16:45   Ct Chest Wo Contrast  02/25/2016  CLINICAL DATA:  History of heart surgery.  Needs clearance for MRI. EXAM: CT CHEST WITHOUT CONTRAST TECHNIQUE: Multidetector CT imaging of the chest was performed following the standard protocol without IV contrast. COMPARISON:  None. FINDINGS: Mediastinum/Lymph Nodes: Previous median sternotomy and CABG procedure. The normal heart size. The trachea appears patent and is midline. Normal appearance of the esophagus. No mediastinal or hilar adenopathy. Lungs/Pleura: No pleural effusion. No airspace consolidation or atelectasis. No suspicious pulmonary nodule or mass. Upper abdomen: No acute findings. Musculoskeletal: Spondylosis is present within the thoracic spine. No aggressive lytic or sclerotic bone lesions. IMPRESSION: 1. No acute cardiopulmonary abnormalities. 2. Previous CABG procedure.  No pacer device identified. Electronically Signed   By: Jerry Kellaylor  Wallace M.D.   On: 02/25/2016 18:54   Mr Brain Wo Contrast  02/25/2016  CLINICAL DATA:  Left-sided facial droop and speech disturbance. EXAM: MRI HEAD WITHOUT CONTRAST TECHNIQUE: Multiplanar, multiecho pulse sequences of the brain and surrounding structures were obtained without intravenous contrast. COMPARISON:  Head CT 02/25/2016 FINDINGS: There is no evidence of acute infarct, intracranial hemorrhage, mass, midline shift, or extra-axial fluid collection. There is mild generalized cerebral atrophy. Foci of T2 hyperintensity in the subcortical and periventricular cerebral white matter  bilaterally are nonspecific but compatible with mild-to-moderate chronic small vessel ischemic disease. 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 Prior right cataract extraction is noted. Depression of the right lamina papyracea is suggestive of an old medial orbital blowout fracture. Small right maxillary sinus mucous retention cysts are noted. The mastoid air cells are clear. Major intracranial vascular flow voids are preserved. IMPRESSION: 1. No acute intracranial abnormality. 2. Mild-to-moderate chronic small vessel ischemic disease. Electronically Signed   By: Sebastian AcheAllen  Grady M.D.   On: 02/25/2016 19:31    Admission HPI:   Jerry Wallace is a 75 year old never smoker with CAD S/P CABG in 1995 and 3 stents placed in Jan and Oct 2016, HTN, T2DM, BPH, and gout who presents with lightheadedness, slurred speech and left-sided numbness that started earlier today. He was driving around 1 PM this afternoon, when he started to feel lightheaded with a "flushed feeling", blurred vision, and slurred speech. He had no symptoms prior to  this. This prompted him to drive around and head back to the hotel where he was staying. Hotel staff there noticed that he continued to feel lightheaded with slurred speech. A medical professional there noted his blood pressure to be in the 120s systolic, CBG in the 200s, with left facial arm and leg numbness. EMS was called and he was brought here. He denies this ever happening before. He denies headache, loss of consciousness, falls, diaphoresis, chest pain, shortness of breath, palpitations, nausea, vomiting, diarrhea, any changes in urination, any focal weakness, or any other symptoms at this time.  Hospital Course by problem list:   1. Left-sided numbness: While driving, he felt lightheaded, flushed, and his speech was slurred. He drove back to the hotel and a nurse noted he had left facial and body numbness. In the hospital, head CT did not show bleed, and brain MRI did not  show a stroke. Neurology evaluated him; given the numbness on the entire left side of the body, this would have been a lesion in the thalamus or brain stem, but because this was not supported on imaging, they did not feel further workup was necessary. He is already on dual antiplatelet therapy and a high intensity statin; he was discharged on his same medication regimen and [facility]  Discharge Vitals:   BP 120/69 mmHg  Pulse 80  Temp(Src) 98.4 F (36.9 C) (Oral)  Resp 15  Ht 6' (1.829 m)  Wt 247 lb 1.6 oz (112.084 kg)  BMI 33.51 kg/m2  SpO2 97%  Discharge Labs:  Results for orders placed or performed during the hospital encounter of 02/25/16 (from the past 24 hour(s))  Protime-INR     Status: None   Collection Time: 02/25/16  4:04 PM  Result Value Ref Range   Prothrombin Time 14.1 11.6 - 15.2 seconds   INR 1.07 0.00 - 1.49  APTT     Status: None   Collection Time: 02/25/16  4:04 PM  Result Value Ref Range   aPTT 29 24 - 37 seconds  CBC     Status: Abnormal   Collection Time: 02/25/16  4:04 PM  Result Value Ref Range   WBC 4.4 4.0 - 10.5 K/uL   RBC 3.73 (L) 4.22 - 5.81 MIL/uL   Hemoglobin 11.7 (L) 13.0 - 17.0 g/dL   HCT 16.1 (L) 09.6 - 04.5 %   MCV 93.8 78.0 - 100.0 fL   MCH 31.4 26.0 - 34.0 pg   MCHC 33.4 30.0 - 36.0 g/dL   RDW 40.9 81.1 - 91.4 %   Platelets 150 150 - 400 K/uL  Differential     Status: None   Collection Time: 02/25/16  4:04 PM  Result Value Ref Range   Neutrophils Relative % 54 %   Neutro Abs 2.3 1.7 - 7.7 K/uL   Lymphocytes Relative 38 %   Lymphs Abs 1.7 0.7 - 4.0 K/uL   Monocytes Relative 6 %   Monocytes Absolute 0.3 0.1 - 1.0 K/uL   Eosinophils Relative 2 %   Eosinophils Absolute 0.1 0.0 - 0.7 K/uL   Basophils Relative 0 %   Basophils Absolute 0.0 0.0 - 0.1 K/uL  Comprehensive metabolic panel     Status: Abnormal   Collection Time: 02/25/16  4:04 PM  Result Value Ref Range    Sodium 140 135 - 145 mmol/L   Potassium 3.7 3.5 - 5.1 mmol/L   Chloride 104 101 - 111  mmol/L   CO2 25 22 - 32 mmol/L   Glucose, Bld 204 (H) 65 - 99 mg/dL   BUN 11 6 - 20 mg/dL   Creatinine, Ser 1.61 0.61 - 1.24 mg/dL   Calcium 9.1 8.9 - 09.6 mg/dL   Total Protein 6.5 6.5 - 8.1 g/dL   Albumin 3.6 3.5 - 5.0 g/dL   AST 20 15 - 41 U/L   ALT 11 (L) 17 - 63 U/L   Alkaline Phosphatase 51 38 - 126 U/L   Total Bilirubin 0.4 0.3 - 1.2 mg/dL   GFR calc non Af Amer >60 >60 mL/min   GFR calc Af Amer >60 >60 mL/min   Anion gap 11 5 - 15  I-stat troponin, ED (not at [facility], [facility])     Status: None   Collection Time: 02/25/16  4:18 PM  Result Value Ref Range   Troponin i, poc 0.00 0.00 - 0.08 ng/mL   Comment 3          I-Stat Chem 8, ED  (not at [facility], [facility])     Status: Abnormal   Collection Time: 02/25/16  4:19 PM  Result Value Ref Range   Sodium 141 135 - 145 mmol/L   Potassium 3.7 3.5 - 5.1 mmol/L   Chloride 101 101 - 111 mmol/L   BUN 15 6 - 20 mg/dL   Creatinine, Ser 0.45 0.61 - 1.24 mg/dL   Glucose, Bld 409 (H) 65 - 99 mg/dL   Calcium, Ion 8.11 9.14 - 1.30 mmol/L   TCO2 28 0 - 100 mmol/L   Hemoglobin 12.2 (L) 13.0 - 17.0 g/dL   HCT 78.2 (L) 95.6 - 21.3 %  Creatinine, serum     Status: None   Collection Time: 02/25/16  9:32 PM  Result Value Ref Range   Creatinine, Ser 0.98 0.61 - 1.24 mg/dL   GFR calc non Af Amer >60 >60 mL/min   GFR calc Af Amer >60 >60 mL/min  Glucose, capillary     Status: Abnormal   Collection Time: 02/25/16 10:10 PM  Result Value Ref Range   Glucose-Capillary 114 (H) 65 - 99 mg/dL  Glucose, capillary     Status: None   Collection Time: 02/26/16  7:29 AM  Result Value Ref Range   Glucose-Capillary 75 65 - 99 mg/dL    Signed: Selina Cooley, MD 02/26/2016, 11:09 AM

## 2016-02-26 NOTE — Evaluation (Signed)
Occupational Therapy Evaluation Patient Details Name: Markon Jares MRN: [id number] DOB: 1941/07/30 Today's Date: 02/26/2016    History of Present Illness Mr. Maher is a 75 year old never smoker with CAD S/P CABG in 1995 and 3 stents placed in Jan and Oct 2016, HTN, T2DM, BPH, and gout who presents with lightheadedness, slurred speech and left-sided numbness that started earlier today. Imaging neg for acute CVA, episode related to hypotension/syncope and/or TIA; he is visiting from [facility]   Clinical Impression   Pt. Is having some numbness in L hand and was ed on techniques to increase sensation. Pt. Reports that his coordination is at baseline. Pt. Was able to get him self dressed without AE. Pt. Required increased time with buttons and reported that he has button hook at home. Pt. Has needed DME at home for toileting and shower. Pt. Does not need further OT at this time.     Follow Up Recommendations  No OT follow up    Equipment Recommendations  None recommended by OT    Recommendations for Other Services       Precautions / Restrictions Precautions Precautions: Fall Precaution Comments: Fall risk greatly reduced with use of assistive device Required Braces or Orthoses: Other Brace/Splint Other Brace/Splint: Pt consistently uses a knee sleeve brace L knee Restrictions Weight Bearing Restrictions: No      Mobility Bed Mobility                  Transfers Overall transfer level: Modified independent Equipment used: Rolling walker (2 wheeled) Transfers: Sit to/from Stand Sit to Stand: Modified independent (Device/Increase time)              Balance                                            ADL Overall ADL's : At baseline                                       General ADL Comments:  (Pt. has buttom hook at home and was using secondary to OA )     Vision     Perception     Praxis      Pertinent Vitals/Pain  Pain Assessment: No/denies pain     Hand Dominance     Extremity/Trunk Assessment Upper Extremity Assessment Upper Extremity Assessment: Overall WFL for tasks assessed           Communication Communication Communication: No difficulties   Cognition Arousal/Alertness: Awake/alert Behavior During Therapy: WFL for tasks assessed/performed Overall Cognitive Status: Within Functional Limits for tasks assessed                     General Comments       Exercises       Shoulder Instructions      Home Living Family/patient expects to be discharged to:: Private residence Living Arrangements: Alone Available Help at Discharge: Friend(s);Available PRN/intermittently Type of Home: House Home Access: Other (comment) (7 steps to enter; stair lift installed recently)     Home Layout: Two level;Able to live on main level with bedroom/bathroom     Bathroom Shower/Tub: Tub/shower unit;Curtain   Bathroom Toilet: Handicapped height     Home Equipment: Cane - single point;Tub bench;Toilet riser  Prior Functioning/Environment Level of Independence: Independent with assistive device(s)        Comments: Uses cane full-time; he tells me he uses it for L knee pain more than for balance    OT Diagnosis:     OT Problem List:     OT Treatment/Interventions:      OT Goals(Current goals can be found in the care plan section) Acute Rehab OT Goals Patient Stated Goal: go home today.  OT Frequency:     Barriers to D/C:            Co-evaluation              End of Session Equipment Utilized During Treatment: Gait belt;Rolling walker  Activity Tolerance: Patient tolerated treatment well Patient left: in bed;with call bell/phone within reach   Time: 1213-1253 OT Time Calculation (min): 40 min Charges:  OT General Charges $OT Visit: 1 Procedure OT Evaluation $OT Eval Moderate Complexity: 1 Procedure OT Treatments $Self Care/Home Management :  23-37 mins G-Codes: OT G-codes **NOT FOR INPATIENT CLASS** Functional Limitation: Self care Self Care Current Status (Z6109(G8987): At least 1 percent but less than 20 percent impaired, limited or restricted Self Care Goal Status (U0454(G8988): At least 1 percent but less than 20 percent impaired, limited or restricted Self Care Discharge Status [phone](G8989): At least 1 percent but less than 20 percent impaired, limited or restricted  Jony Ladnier 02/26/2016, 1:01 PM

## 2016-02-26 NOTE — Plan of Care (Signed)
Problem: Education: Goal: Knowledge of secondary prevention will improve Outcome: Completed/Met Date Met:  02/26/16 Patient received stroke educational booklet. Patient was educated about the importance of his medications in relation to stroke prevention such as atorvastatin, plavix, and aspirin. Patient was educated about the importance of following up with his doctor after he is discharged. Patient is aware of the signs and symptoms of a stroke and understands that having a TIA puts him at risk for having a stroke in the future.   Problem: Self-Care: Goal: Ability to participate in self-care as condition permits will improve Outcome: Completed/Met Date Met:  02/26/16 Patient is able to perform ADLs at his baseline level. Goal: Ability to communicate needs accurately will improve Outcome: Completed/Met Date Met:  02/26/16 Patient is able to communicate at his baseline level.

## 2016-02-26 NOTE — Progress Notes (Signed)
CM spoke with pt who states [facility], OT and can add on PT himself.  CM called [facility] DME rep, Trey PaulaJeff to please deliver the rolling walker and 3n1 to room prior to discharge.

## 2016-02-26 NOTE — Evaluation (Signed)
Physical Therapy Evaluation Patient Details Name: Jerry Wallace MRN: [id number] DOB: [birth year] Today's Date: 02/26/2016   History of Present Illness  Jerry Wallace is a 75 year old never smoker with CAD S/P CABG in 1995 and 3 stents placed in Jan and Oct 2016, HTN, T2DM, BPH, and gout who presents with lightheadedness, slurred speech and left-sided numbness that started earlier today. Imaging neg for acute CVA, episode related to hypotension/syncope and/or TIA; he is visiting from [facility]  Clinical Impression   Pt admitted with above diagnosis. Pt currently with functional limitations due to the deficits listed below (see PT Problem List).   No reports of dizziness, or syncopal symptoms throughout session; Noted pt had difficulty donning the knee sleeve brace that he typically can put on independently; we briefly discussed using a reacher to help don brace -- will also defer to OT;  Pt will benefit from skilled PT to increase their independence and safety with mobility to allow discharge to the venue listed below.       Follow Up Recommendations Home health PT;Other (comment) (intermittent assist; He tells me a Home Health NP visits, and I'm of course game for HHPT, and possibly an Aide -- I'm just not sure that he is homebound)    Engineer, agricultural with 5" wheels;3in1 (PT) (pt is agreeable)    Recommendations for Other Services OT consult     Precautions / Restrictions Precautions Precautions: Fall Precaution Comments: Fall risk greatly reduced with use of assistive device Required Braces or Orthoses: Other Brace/Splint Other Brace/Splint: Pt consistently uses a knee sleeve brace L knee Restrictions Weight Bearing Restrictions: No      Mobility  Bed Mobility               General bed mobility comments: Sitting EOB upon my arrival  Transfers Overall transfer level: Needs assistance Equipment used: Straight cane Transfers: Sit to/from Stand Sit  to Stand: Min guard (without physical contact)         General transfer comment: noting slow and some difficutly rising form bed; no physical assist needed  Ambulation/Gait Ambulation/Gait assistance: Min guard;Supervision Ambulation Distance (Feet): 190 Feet Assistive device: Straight cane;Rolling walker (2 wheeled) Gait Pattern/deviations: Step-through pattern;Decreased step length - right;Decreased stance time - left   Gait velocity interpretation: Below normal speed for age/gender General Gait Details: minguard assist with walking with straight cane; Switched to RW to assess if that makes walking less painful withthe ability to unweigh painful LLE in stance, and Jerry Wallace' steps were more efficient, Supervision level  Stairs            Wheelchair Mobility    Modified Rankin (Stroke Patients Only)       Balance Overall balance assessment: Needs assistance             Standing balance comment: Jerry Wallace' benefits from having UE support in standing                             Pertinent Vitals/Pain Pain Assessment: 0-10 Pain Score: 7  Pain Location: L knee Pain Descriptors / Indicators: Aching Pain Intervention(s): Monitored during session;Other (comment) (reports he woudl rather not take medicine)    Home Living Family/patient expects to be discharged to:: Private residence Living Arrangements: Alone Available Help at Discharge: Friend(s);Available PRN/intermittently Type of Home: House Home Access: Other (comment) (7 steps to enter; stair lift installed recently)     Home Layout: Two level;Able  to live on main level with bedroom/bathroom Home Equipment: Gilmer MorCane - single point;Toilet riser      Prior Function Level of Independence: Independent with assistive device(s)         Comments: Uses cane full-time; he tells me he uses it for L knee pain more than for balance     Hand Dominance        Extremity/Trunk Assessment   Upper  Extremity Assessment: Defer to OT evaluation (noting some difficulty manipulating knee sleeve to don)           Lower Extremity Assessment: RLE deficits/detail RLE Deficits / Details: Decr AROM and strength, limited by pain/soreness, which he has dealt with for a while PTA; Reports he is nervouse that his knee with "give way" and that he plans to go to [facility] to have it checked out soon       Communication   Communication: No difficulties  Cognition Arousal/Alertness: Awake/alert Behavior During Therapy: WFL for tasks assessed/performed Overall Cognitive Status: Within Functional Limits for tasks assessed                      General Comments General comments (skin integrity, edema, etc.): No reports of dizziness, or syncopal symptoms throughout session; Noted pt had difficulty donning the knee sleeve brace that he typically can put on independently; we briefly discussed using a reacher to help don brace -- will also defer to OT    Exercises        Assessment/Plan    PT Assessment Patient needs continued PT services  PT Diagnosis Difficulty walking;Acute pain   PT Problem List Decreased strength;Decreased range of motion;Decreased activity tolerance;Decreased balance;Decreased mobility;Pain  PT Treatment Interventions DME instruction;Gait training;Stair training;Functional mobility training;Therapeutic activities;Therapeutic exercise;Balance training;Patient/family education   PT Goals (Current goals can be found in the Care Plan section) Acute Rehab PT Goals Patient Stated Goal: Hopes to be able to go home soon PT Goal Formulation: With patient Time For Goal Achievement: 03/04/16 Potential to Achieve Goals: Good    Frequency Min 3X/week   Barriers to discharge   IT looks like he must drive himself home    Co-evaluation               End of Session Equipment Utilized During Treatment: Gait belt Activity Tolerance: Patient tolerated treatment well Patient  left: in bed;with call bell/phone within reach (sitting EOB to eat breakfast) Nurse Communication: Mobility status;Other (comment) (R knee pain, but not requesting meds)    Functional Assessment Tool Used: Clinical Judgement Functional Limitation: Mobility: Walking and moving around Mobility: Walking and Moving Around Current Status (Z6109(G8978): At least 1 percent but less than 20 percent impaired, limited or restricted Mobility: Walking and Moving Around Goal Status [phone]([id number]): 0 percent impaired, limited or restricted    Time: 0757-0828 PT Time Calculation (min) (ACUTE ONLY): 31 min   Charges:   PT Evaluation $PT Eval Moderate Complexity: 1 Procedure PT Treatments $Gait Training: 8-22 mins   PT G Codes:   PT G-Codes **NOT FOR INPATIENT CLASS** Functional Assessment Tool Used: Clinical Judgement Functional Limitation: Mobility: Walking and moving around Mobility: Walking and Moving Around Current Status (U9811(G8978): At least 1 percent but less than 20 percent impaired, limited or restricted Mobility: Walking and Moving Around Goal Status [phone](G8979): 0 percent impaired, limited or restricted    Van ClinesGarrigan, Krystan [facility] 02/26/2016, 8:47 AM  Van ClinesHolly Malayah Demuro, PT  Acute Rehabilitation Services Pager [phone] Office [phone]

## 2016-02-26 NOTE — Progress Notes (Signed)
VASCULAR LAB PRELIMINARY  PRELIMINARY  PRELIMINARY  PRELIMINARY  Carotid duplex completed.    Preliminary report:  1-39% ICA plaquing. Vertebral artery flow is antegrade.   Ronee Ranganathan, RVT 02/26/2016, 10:40 AM

## 2017-05-08 ENCOUNTER — Ambulatory Visit: Payer: Self-pay | Admitting: Family Medicine

## 2017-05-08 ENCOUNTER — Ambulatory Visit (INDEPENDENT_AMBULATORY_CARE_PROVIDER_SITE_OTHER): Payer: Medicare HMO | Admitting: Family Medicine

## 2017-05-08 ENCOUNTER — Encounter: Payer: Self-pay | Admitting: Family Medicine

## 2017-05-08 VITALS — BP 112/78 | HR 71 | Resp 16 | Ht 72.0 in | Wt 237.0 lb

## 2017-05-08 DIAGNOSIS — M1990 Unspecified osteoarthritis, unspecified site: Secondary | ICD-10-CM | POA: Insufficient documentation

## 2017-05-08 DIAGNOSIS — I25708 Atherosclerosis of coronary artery bypass graft(s), unspecified, with other forms of angina pectoris: Secondary | ICD-10-CM | POA: Diagnosis not present

## 2017-05-08 DIAGNOSIS — N400 Enlarged prostate without lower urinary tract symptoms: Secondary | ICD-10-CM | POA: Insufficient documentation

## 2017-05-08 DIAGNOSIS — Z79899 Other long term (current) drug therapy: Secondary | ICD-10-CM | POA: Insufficient documentation

## 2017-05-08 DIAGNOSIS — G459 Transient cerebral ischemic attack, unspecified: Secondary | ICD-10-CM

## 2017-05-08 DIAGNOSIS — K648 Other hemorrhoids: Secondary | ICD-10-CM

## 2017-05-08 DIAGNOSIS — R2681 Unsteadiness on feet: Secondary | ICD-10-CM | POA: Diagnosis not present

## 2017-05-08 DIAGNOSIS — E785 Hyperlipidemia, unspecified: Secondary | ICD-10-CM | POA: Diagnosis not present

## 2017-05-08 DIAGNOSIS — M1A9XX0 Chronic gout, unspecified, without tophus (tophi): Secondary | ICD-10-CM | POA: Diagnosis not present

## 2017-05-08 DIAGNOSIS — E1140 Type 2 diabetes mellitus with diabetic neuropathy, unspecified: Secondary | ICD-10-CM | POA: Diagnosis not present

## 2017-05-08 DIAGNOSIS — E559 Vitamin D deficiency, unspecified: Secondary | ICD-10-CM

## 2017-05-08 DIAGNOSIS — M75101 Unspecified rotator cuff tear or rupture of right shoulder, not specified as traumatic: Secondary | ICD-10-CM

## 2017-05-08 DIAGNOSIS — I10 Essential (primary) hypertension: Secondary | ICD-10-CM

## 2017-05-08 DIAGNOSIS — Z9889 Other specified postprocedural states: Secondary | ICD-10-CM

## 2017-05-08 DIAGNOSIS — N529 Male erectile dysfunction, unspecified: Secondary | ICD-10-CM

## 2017-05-08 DIAGNOSIS — E669 Obesity, unspecified: Secondary | ICD-10-CM | POA: Diagnosis not present

## 2017-05-08 DIAGNOSIS — M1611 Unilateral primary osteoarthritis, right hip: Secondary | ICD-10-CM | POA: Diagnosis not present

## 2017-05-08 DIAGNOSIS — K602 Anal fissure, unspecified: Secondary | ICD-10-CM

## 2017-05-08 DIAGNOSIS — G4733 Obstructive sleep apnea (adult) (pediatric): Secondary | ICD-10-CM

## 2017-05-08 DIAGNOSIS — K635 Polyp of colon: Secondary | ICD-10-CM

## 2017-05-08 DIAGNOSIS — J309 Allergic rhinitis, unspecified: Secondary | ICD-10-CM

## 2017-05-08 DIAGNOSIS — K439 Ventral hernia without obstruction or gangrene: Secondary | ICD-10-CM

## 2017-05-08 DIAGNOSIS — Z23 Encounter for immunization: Secondary | ICD-10-CM

## 2017-05-08 DIAGNOSIS — N401 Enlarged prostate with lower urinary tract symptoms: Secondary | ICD-10-CM

## 2017-05-08 DIAGNOSIS — R351 Nocturia: Secondary | ICD-10-CM

## 2017-05-08 DIAGNOSIS — K219 Gastro-esophageal reflux disease without esophagitis: Secondary | ICD-10-CM

## 2017-05-08 DIAGNOSIS — R600 Localized edema: Secondary | ICD-10-CM

## 2017-05-08 DIAGNOSIS — Z9989 Dependence on other enabling machines and devices: Secondary | ICD-10-CM

## 2017-05-08 MED ORDER — ZOSTER VAC RECOMB ADJUVANTED 50 MCG/0.5ML IM SUSR: 0.5000 mL | Freq: Once | INTRAMUSCULAR | 1 refills | Status: AC

## 2017-05-08 MED ORDER — ACETAMINOPHEN 500 MG PO TABS: 1000.0000 mg | ORAL_TABLET | Freq: Two times a day (BID) | ORAL | 0 refills | Status: AC

## 2017-05-08 NOTE — Progress Notes (Signed)
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 ); and Shoulder Pain   History of Present Illness:  This is a 76 y.o. male seen for initial visit, [facility]. Seen there recently for progressive R hip pain, XR showed OA, told to take ibuprofen 1200 mg bid and tramadol 100 mg prn, notices confusion/sedation when takes tramadol. Also has R rotator cuff tear for which he has received injections. Interested in local PT and ortho second opinion. CAD s/p CABG 1996, last used NTG 2 months ago, stopped Imdur and Lipitor. 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 HTN well controlled on metoprolol. OSA on CPAP, just restarted using. T2DM well controlled on metformin (last a1c in chart 7.2% in Jan) with LE neuropathy marginally controlled on gabapentin. BPH/ED off Proscar and Cardura on Vesicare and Cialis daily per [facility]. Still with nocturia x 5. Reports stable DOE and BLE edema improved on Lasix x 6 months. NP and nutritionist see monthly. GERD on Prevacid bid, worse if misses dose, c/o sharp LUQ pain when eats heavy meal. Ventral hernia without sxs. Hx C4/5 surgery years ago.  Overdue for optho exam. Tet and pneumo imm 2 yrs ago, no zoster imm known. Colonoscopy 2015 with polyps/anal fissure/int hemorrhoids. No med list with pt, from memory only, would like to decrease pill burden.  Review of Systems:  Review of Systems  Constitutional: Negative for chills, fever and unexpected weight change.  HENT: Negative for ear pain, sinus pain and trouble swallowing.   Eyes: Negative for pain.  Respiratory: Negative for cough and shortness of breath.   Cardiovascular: Negative for chest pain and leg swelling.  Gastrointestinal: Negative for blood in stool.  Endocrine: Negative for polydipsia and polyuria.  Genitourinary: Negative for  difficulty urinating and dysuria.  Neurological: Negative for tremors, syncope and light-headedness.  Hematological: Negative for adenopathy.  Psychiatric/Behavioral: Negative for dysphoric mood.    Patient Active Problem List   Diagnosis Date Noted  . Osteoarthritis 05/08/2017  . Vitamin D deficiency 05/08/2017  . Gait instability 05/08/2017  . OSA on CPAP 05/08/2017  . BPH (benign prostatic hyperplasia) 05/08/2017  . Erectile dysfunction 05/08/2017  . GERD (gastroesophageal reflux disease) 05/08/2017  . Pedal edema 05/08/2017  . Ventral hernia 05/08/2017  . Anal fissure 05/08/2017  . Hx of cervical spine surgery 05/08/2017  . Current use of proton pump inhibitor 05/08/2017  . Left-sided sensory deficit present   . TIA (transient ischemic attack) 02/25/2016  . Type 2 diabetes, controlled, with neuropathy (HCC) 02/25/2016  . Essential hypertension 02/25/2016  . 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 Gout 02/25/2016  . Hyperlipidemia 02/25/2016  . Lens replaced 09/19/2012  . 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 Obesity 01/15/2009    Prior to Admission medications   Medication Sig Start Date End Date Taking? Authorizing Provider  allopurinol (ZYLOPRIM) 300 MG tablet Take 300 mg by mouth daily.   Yes [provider]  aspirin EC 81 MG tablet Take 81 mg by mouth daily.   Yes [provider]  cholecalciferol (VITAMIN D) 1000 units tablet Take 1,000 Units by mouth daily.   Yes [provider]  docusate sodium (COLACE) 50 MG capsule Take 100 mg by mouth 2 (two) times daily.   Yes [provider]  furosemide (LASIX) 20 MG tablet Take 20 mg by mouth 2 (two) times daily.   Yes [provider]  gabapentin (NEURONTIN) 400 MG capsule Take 800 mg by mouth 2 (two) times daily.   Yes [provider]  Hypromellose 0.4 % SOLN Frequency:PHARMDIR   Dosage:0.0     Instructions:  Note:instill 1 drop in both eyes QID Dose: 1 05/09/09  Yes  [provider]  lidocaine (LIDODERM) 5 % Place 1 patch onto the skin at bedtime as needed.   Yes [provider]  loratadine (CLARITIN) 10 MG tablet Take 10 mg by mouth daily.   Yes [provider]  magnesium oxide (MAG-OX) 400 MG tablet Take 800 mg by mouth daily.   Yes [provider]  metFORMIN (GLUCOPHAGE) 1000 MG tablet Take 1,000 mg by mouth 2 (two) times daily. 06/16/09  Yes [provider]  metoprolol tartrate (LOPRESSOR) 50 MG tablet Take 25 mg by mouth 2 (two) times daily. 05/09/09  Yes [provider]  Multiple Vitamins-Minerals (MULTIVITAMIN WITH MINERALS) tablet Take 1 tablet by mouth. 06/16/09  Yes [provider]  nitroGLYCERIN (NITROSTAT) 0.4 MG SL tablet Place 0.4 mg under the tongue every 5 (five) minutes as needed for chest pain.    Yes [provider]  pantoprazole (PROTONIX) 40 MG tablet Take 40 mg by mouth 2 (two) times daily.   Yes [provider]  phenylephrine-shark liver oil-mineral oil-petrolatum (PREPARATION H) 0.25-3-14-71.9 % rectal ointment Place 1 application rectally at bedtime as needed.   Yes [provider]  solifenacin (VESICARE) 5 MG tablet Take 5 mg by mouth. 01/08/17 01/08/18 Yes [provider]  tadalafil (CIALIS) 5 MG tablet Take 5 mg by mouth daily.   Yes [provider]  traMADol (ULTRAM) 50 MG tablet Take 50 mg by mouth.   Yes [provider]  acetaminophen (TYLENOL) 500 MG tablet Take 2 tablets (1,000 mg total) by mouth 2 (two) times daily. 05/08/17   Elmo Rio, Chrissie Noa, MD  Zoster Vac Recomb Adjuvanted [facility]) injection Inject 0.5 mLs into the muscle once. 05/08/17 05/08/17  Schuyler Amor, MD    Allergies  Allergen Reactions  . Penicillin G Anaphylaxis  . Lisinopril     Other reaction(s): MUSCLE PAIN  . Penicillins Swelling    Other reaction(s): ANAPHYLAXIS Has patient had a PCN reaction causing immediate rash, facial/tongue/throat  swelling, SOB or lightheadedness with hypotension: Yes Has patient had a PCN reaction causing severe rash involving mucus membranes or skin necrosis: No Has patient had a PCN reaction that required hospitalization No Has patient had a PCN reaction occurring within the last 10 years: No If all of the above answers are "NO", then may proceed with Cephalosporin use.   . Pseudoephedrine Hcl     Other reaction(s): RASH  . Dimetapp Cold-Allergy [Brompheniramine-Phenylephrine] Rash    Joint pain  . Triprolidine-Pseudoephedrine Rash    Past Surgical History:  Procedure Laterality Date  . CARDIAC SURGERY     CABG 1996  . CARPAL TUNNEL RELEASE      Social History  Substance Use Topics  . Smoking status: Never Smoker  . Smokeless tobacco: Never Used  . Alcohol use Yes     Comment: occasional     No family history on file.  Medication list has been reviewed and updated.  Physical Examination: BP 112/78   Pulse 71   Resp 16   Ht 6' (1.829 m)   Wt 237 lb (107.5 kg)  SpO2 98%   BMI 32.14 kg/m   Physical Exam  Constitutional: He is oriented to person, place, and time. He appears well-developed and well-nourished.  HENT:  Head: Normocephalic and atraumatic.  Right Ear: External ear normal.  Left Ear: External ear normal.  Nose: Nose normal.  Mouth/Throat: Oropharynx is clear and moist.  B TMs obscured by cerumen  Eyes: Conjunctivae and EOM are normal. Pupils are equal, round, and reactive to light. No scleral icterus.  Neck: Neck supple. No thyromegaly present.  Cardiovascular: Normal rate, regular rhythm, normal heart sounds and intact distal pulses.   Pulmonary/Chest: Effort normal and breath sounds normal.  Abdominal: Soft. He exhibits no distension and no mass. There is no tenderness.  Ventral hernia, reducible  Musculoskeletal:  Pain with R hip extension and rotation L hip exam normal R anterior shoulder tenderness with painful abduction past 90 degrees Trace BLE  edema  Lymphadenopathy:    He has no cervical adenopathy.  Neurological: He is alert and oriented to person, place, and time. Coordination normal.  Romberg positive, gait unsteady  Skin: Skin is warm and dry.  Psychiatric: He has a normal mood and affect. His behavior is normal.  Nursing note and vitals reviewed.   Assessment and Plan:  1. Coronary artery disease of bypass graft of native heart with stable angina pectoris (HCC) Cont metoprolol/asa/NTG prn, allergy to lisinopril in chart - Comprehensive Metabolic Panel (CMET) - CBC  2. Type 2 diabetes, controlled, with neuropathy (HCC) Well controlled on metformin, MCR ok in Jan, neuropathy marginally controlled on gabapentin, consider increase - TSH - HgB A1c  3. Essential hypertension Well controlled on metoprolol  4. Transient cerebral ischemia, unspecified type Cont asa  5. Chronic gout without tophus, unspecified cause, unspecified site On allopurinol, ast attack yesterday per pt - Uric acid  6. Hyperlipidemia, unspecified hyperlipidemia type Off Lipitor - Lipid Profile  7. Obesity (BMI 30-39.9) Unable to exercise due to hip pain  8. Osteoarthritis of right hip, unspecified osteoarthritis type Poor pain control on ibuprofen/tramadol, change ibuprofen to Tylenol 1000 mg bid - Ambulatory referral to Physical Therapy - Ambulatory referral to Orthopedic Surgery  9. Tear of right rotator cuff, unspecified tear extent - Ambulatory referral to Physical Therapy - Ambulatory referral to Orthopedic Surgery  10. Vitamin D deficiency On supplement - Vitamin D (25 hydroxy)  11. Gait instability - B12  12. OSA on CPAP  13. Benign prostatic hyperplasia with nocturia On Vesicare/Cialis per urology with persistent nocturia  14. Erectile dysfunction, unspecified erectile dysfunction type On daily Cialis per urology  15. Gastroesophageal reflux disease, esophagitis presence not specified On PPI bid, consider  taper  16. Pedal edema Improved on Lasix daily  17. Ventral hernia without obstruction or gangrene Asymptomatic  18. Anal fissure Uses unknown cream  19. Current use of proton pump inhibitor  20. Hx of cervical spine surgery  21. Polyp of colon, unspecified part of colon, unspecified type S/p colonoscopy 2015  22. Internal hemorrhoids Uses Preparation H  23. Need for zoster imm Shingrix rx sent  24. AR On Claritin daily  25. Med review Consider d/c Mg oxide, tramadol taper  Return in about 4 weeks (around 06/05/2017).   One hour spent with patient over half in counseling  Dionne Ano. Kingsley Spittle MD [facility]  05/08/2017

## 2017-05-08 NOTE — Patient Instructions (Signed)
Stop ibuprofen, use Tylenol extra strength (500 mg) two tablets twice daily.

## 2017-05-09 ENCOUNTER — Other Ambulatory Visit: Payer: Self-pay | Admitting: Family Medicine

## 2017-05-09 LAB — COMPREHENSIVE METABOLIC PANEL
ALK PHOS: 77 IU/L (ref 39–117)
ALT: 8 IU/L (ref 0–44)
AST: 16 IU/L (ref 0–40)
Albumin/Globulin Ratio: 1.7 (ref 1.2–2.2)
Albumin: 4.3 g/dL (ref 3.5–4.8)
BUN/Creatinine Ratio: 15 (ref 10–24)
BUN: 16 mg/dL (ref 8–27)
Bilirubin Total: 0.3 mg/dL (ref 0.0–1.2)
CHLORIDE: 100 mmol/L (ref 96–106)
CO2: 26 mmol/L (ref 20–29)
CREATININE: 1.06 mg/dL (ref 0.76–1.27)
Calcium: 9.7 mg/dL (ref 8.6–10.2)
GFR calc Af Amer: 78 mL/min/{1.73_m2} (ref >59)
GFR calc non Af Amer: 67 mL/min/{1.73_m2} (ref >59)
GLOBULIN, TOTAL: 2.5 g/dL (ref 1.5–4.5)
GLUCOSE: 191 mg/dL — AB (ref 65–99)
Potassium: 4.3 mmol/L (ref 3.5–5.2)
SODIUM: 143 mmol/L (ref 134–144)
Total Protein: 6.8 g/dL (ref 6.0–8.5)

## 2017-05-09 LAB — LIPID PANEL
Chol/HDL Ratio: 4.5 ratio (ref 0.0–5.0)
Cholesterol, Total: 198 mg/dL (ref 100–199)
HDL: 44 mg/dL (ref >39)
LDL Calculated: 122 mg/dL — ABNORMAL HIGH (ref 0–99)
TRIGLYCERIDES: 161 mg/dL — AB (ref 0–149)
VLDL CHOLESTEROL CAL: 32 mg/dL (ref 5–40)

## 2017-05-09 LAB — CBC
Hematocrit: 38.7 % (ref 37.5–51.0)
Hemoglobin: 13 g/dL (ref 13.0–17.7)
MCH: 31.8 pg (ref 26.6–33.0)
MCHC: 33.6 g/dL (ref 31.5–35.7)
MCV: 95 fL (ref 79–97)
PLATELETS: 171 10*3/uL (ref 150–379)
RBC: 4.09 x10E6/uL — AB (ref 4.14–5.80)
RDW: 14.8 % (ref 12.3–15.4)
WBC: 4.7 10*3/uL (ref 3.4–10.8)

## 2017-05-09 LAB — VITAMIN B12: VITAMIN B 12: 337 pg/mL (ref 232–1245)

## 2017-05-09 LAB — HEMOGLOBIN A1C
Est. average glucose Bld gHb Est-mCnc: 171 mg/dL
HEMOGLOBIN A1C: 7.6 % — AB (ref 4.8–5.6)

## 2017-05-09 LAB — URIC ACID: Uric Acid: 5.1 mg/dL (ref 3.7–8.6)

## 2017-05-09 LAB — VITAMIN D 25 HYDROXY (VIT D DEFICIENCY, FRACTURES): Vit D, 25-Hydroxy: 24 ng/mL — ABNORMAL LOW (ref 30.0–100.0)

## 2017-05-09 LAB — TSH: TSH: 0.802 u[IU]/mL (ref 0.450–4.500)

## 2017-05-09 MED ORDER — PRAVASTATIN SODIUM 40 MG PO TABS: 40.0000 mg | ORAL_TABLET | Freq: Every day | ORAL | 2 refills | Status: AC

## 2017-05-09 MED ORDER — VITAMIN D 50 MCG (2000 UT) PO CAPS: 1.0000 | ORAL_CAPSULE | Freq: Every day | ORAL | Status: AC

## 2017-05-20 ENCOUNTER — Encounter: Payer: Self-pay | Admitting: Physical Therapy

## 2017-05-20 ENCOUNTER — Ambulatory Visit: Payer: Medicare HMO | Attending: Family Medicine | Admitting: Physical Therapy

## 2017-05-20 DIAGNOSIS — G8929 Other chronic pain: Secondary | ICD-10-CM

## 2017-05-20 DIAGNOSIS — M25611 Stiffness of right shoulder, not elsewhere classified: Secondary | ICD-10-CM | POA: Diagnosis present

## 2017-05-20 DIAGNOSIS — M6281 Muscle weakness (generalized): Secondary | ICD-10-CM | POA: Diagnosis present

## 2017-05-20 DIAGNOSIS — M25551 Pain in right hip: Secondary | ICD-10-CM

## 2017-05-20 DIAGNOSIS — M25659 Stiffness of unspecified hip, not elsewhere classified: Secondary | ICD-10-CM

## 2017-05-20 DIAGNOSIS — R2681 Unsteadiness on feet: Secondary | ICD-10-CM

## 2017-05-20 DIAGNOSIS — M25511 Pain in right shoulder: Secondary | ICD-10-CM | POA: Insufficient documentation
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
## 2017-05-23 ENCOUNTER — Ambulatory Visit: Payer: Medicare HMO | Admitting: Physical Therapy

## 2017-05-23 ENCOUNTER — Encounter: Payer: Self-pay | Admitting: Physical Therapy

## 2017-05-23 DIAGNOSIS — R2681 Unsteadiness on feet: Secondary | ICD-10-CM

## 2017-05-23 DIAGNOSIS — G8929 Other chronic pain: Secondary | ICD-10-CM

## 2017-05-23 DIAGNOSIS — M25511 Pain in right shoulder: Secondary | ICD-10-CM | POA: Diagnosis not present

## 2017-05-23 DIAGNOSIS — M6281 Muscle weakness (generalized): Secondary | ICD-10-CM

## 2017-05-23 DIAGNOSIS — M25611 Stiffness of right shoulder, not elsewhere classified: Secondary | ICD-10-CM

## 2017-05-23 DIAGNOSIS — M25659 Stiffness of unspecified hip, not elsewhere classified: Secondary | ICD-10-CM

## 2017-05-23 DIAGNOSIS — M25551 Pain in right hip: Secondary | ICD-10-CM

## 2017-05-23 NOTE — Therapy (Signed)
[facility] [address] [place], [place], [address] Phone: [phone]   Fax:  [phone]  Physical Therapy Treatment  Patient Details  Name: Jerry Wallace MRN: [id number] Date of Birth: 07/01/1941 Referring Provider: Schuyler AmorWilliam Plonk, MD  Encounter Date: 05/23/2017      PT End of Session - 05/23/17 1739    Visit Number 2   Number of Visits 8   Date for PT Re-Evaluation 06/17/17   Authorization Type medicare   Authorization - Visit Number 2   Authorization - Number of Visits 10   PT Start Time 1542   PT Stop Time 1629   PT Time Calculation (min) 47 min   Activity Tolerance Patient tolerated treatment well;Patient limited by pain   Behavior During Therapy [facility] for tasks assessed/performed      Past Medical History:  Diagnosis Date  . Coronary artery disease   . Diabetes mellitus without complication (HCC)   . Hypertension     Past Surgical History:  Procedure Laterality Date  . CARDIAC SURGERY     CABG 1996  . CARPAL TUNNEL RELEASE      There were no vitals filed for this visit.      Subjective Assessment - 05/23/17 1736    Subjective Pt reports 5/10 R hip pain and 7/10 R shoulder pain. Pt reports he was very sore after the previous tx session but states the soreness wore off in a few hours and he felt better than he has in quite some time. Pt also states he did not get to sleep until 5am this morning due to his R shoulder pain keeping him awake.   Limitations Lifting;Standing;Walking;Writing;House hold activities   Patient Stated Goals Increase R shoulder/ hip ROM and strengthn to improve pain-free mobility.     Currently in Pain? Yes   Pain Score 7    Pain Location Shoulder   Pain Orientation Right   Pain Descriptors / Indicators Aching   Pain Onset More than a month ago   Pain Frequency Constant   Multiple Pain Sites Yes   Pain Score 5   Pain Location Hip   Pain Orientation Right   Pain Descriptors /  Indicators Aching   Pain Type Chronic pain   Pain Onset More than a month ago   Pain Frequency Constant     R hip treatment:  Manual: Hamstring stretches 5x30s BLE Hip distraction mobilization on R 3x30s  TherEx: Hamstring stretches on step 2x30s BLE  R shoulder treatment:  Manual: AP mobs grade II to R shoulder 45deg flexion Inferior glides grade II to R shoulder 60deg flexion  TherEx: AAROM with wand: flexion, abduction, ER 2x15 each direction Overhead pulley abduction and flexion x15 each direction         PT Education - 05/23/17 1642    Education provided Yes   Education Details Standing hamstring stretch   Person(s) Educated Patient   Methods Explanation;Demonstration;Handout   Comprehension Verbalized understanding;Returned demonstration             PT Long Term Goals - 05/20/17 1911      PT LONG TERM GOAL #1   Title Pt. will report worse pain of 6/10 in both R shoulder and R hip in order to improve quality of life   Baseline Worse pain in both is 9/10 on 05/20/17   Time 4   Period Weeks   Status New     PT LONG TERM GOAL #2   Title  Pt. will decrease QuickDASH score to <40% in order to increase function of R shoulder   Baseline 57% on 7/23   Time 4   Period Weeks   Status New     PT LONG TERM GOAL #3   Title Pt. will increase LEFS score 27/80 in order to increase function of R hip   Baseline 17/80 on 05/20/17   Time 4   Period Weeks   Status New     PT LONG TERM GOAL #4   Title Pt. will increase BLE strength to 4+/5 in order to participate in desired activities   Baseline 3+/5 hip flexion, 4/5 knee flexion and extension on 05/20/17   Time 4   Period Weeks   Status New               Plan - 05/23/17 1743    Clinical Impression Statement Pt demonstrates decrease in R hip pain from 8/10 to 5/10 since previous session. Pt continues to demonstrate decrease AROM and PROM of R shoulder but is able to tolerate AAROM with wand. Pt responded  well to inferior and AP mobs grade II to R shoulder, but stated there was pain during. Pt continues to demonstrate BLE hamstring tightness and was educated in home hamstring stretches. Pt will continue to benefit from skilled PT in order to decrease R hip and R shoulder pain, increase AROM, and increase strength in order to participate in desired activities.    Clinical Presentation Stable   Clinical Decision Making Moderate   Rehab Potential Fair   PT Frequency 2x / week   PT Duration 4 weeks   PT Treatment/Interventions ADLs/Self Care Home Management;Aquatic Therapy;Cryotherapy;Electrical Stimulation;Moist Heat;Therapeutic exercise;Therapeutic activities;Iontophoresis 4mg /ml Dexamethasone;Stair training;Gait training;Balance training;Neuromuscular re-education;Patient/family education;Manual techniques;Passive range of motion   PT Next Visit Plan manual techniques on hip and shoulder, range of motion   PT Home Exercise Plan see handout   Consulted and Agree with Plan of Care Patient      Patient will benefit from skilled therapeutic intervention in order to improve the following deficits and impairments:  Abnormal gait, Decreased activity tolerance, Decreased balance, Decreased mobility, Decreased strength, Decreased endurance, Decreased range of motion, Hypomobility, Difficulty walking, Increased fascial restricitons, Impaired flexibility, Postural dysfunction, Improper body mechanics, Pain  Visit Diagnosis: Chronic right shoulder pain  Pain in right hip  Decreased range of motion of right shoulder  Decreased range of motion of hip  Muscle weakness (generalized)  Unsteadiness on feet     Problem List Patient Active Problem List   Diagnosis Date Noted  . Osteoarthritis 05/08/2017  . Vitamin D deficiency 05/08/2017  . Gait instability 05/08/2017  . OSA on CPAP 05/08/2017  . BPH (benign prostatic hyperplasia) 05/08/2017  . Erectile dysfunction 05/08/2017  . GERD  (gastroesophageal reflux disease) 05/08/2017  . Pedal edema 05/08/2017  . Ventral hernia 05/08/2017  . Anal fissure 05/08/2017  . Hx of cervical spine surgery 05/08/2017  . Current use of proton pump inhibitor 05/08/2017  . Colon polyps 05/08/2017  . Internal hemorrhoids 05/08/2017  . Allergic rhinitis 05/08/2017  . Left-sided sensory deficit present   . TIA (transient ischemic attack) 02/25/2016  . Type 2 diabetes, controlled, with neuropathy (HCC) 02/25/2016  . Essential hypertension 02/25/2016  . 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 Gout 02/25/2016  . Hyperlipidemia 02/25/2016  . Lens replaced 09/19/2012  . 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 Obesity 01/15/2009   Cammie Mcgee, PT, DPT # [id number] Nickola Major, SPT 05/24/2017,  1:20 PM  [facility] [address] [place], [place], [address] Phone: [phone]   Fax:  [phone]  Name: Jerry Wallace MRN: [id number] Date of Birth: 05/15/1941

## 2017-05-28 ENCOUNTER — Encounter: Payer: Self-pay | Admitting: Physical Therapy

## 2017-05-28 ENCOUNTER — Ambulatory Visit: Payer: Medicare HMO | Admitting: Physical Therapy

## 2017-05-28 DIAGNOSIS — M25551 Pain in right hip: Secondary | ICD-10-CM

## 2017-05-28 DIAGNOSIS — M6281 Muscle weakness (generalized): Secondary | ICD-10-CM

## 2017-05-28 DIAGNOSIS — M25659 Stiffness of unspecified hip, not elsewhere classified: Secondary | ICD-10-CM

## 2017-05-28 DIAGNOSIS — M25511 Pain in right shoulder: Principal | ICD-10-CM

## 2017-05-28 DIAGNOSIS — R2681 Unsteadiness on feet: Secondary | ICD-10-CM

## 2017-05-28 DIAGNOSIS — G8929 Other chronic pain: Secondary | ICD-10-CM

## 2017-05-28 DIAGNOSIS — M25611 Stiffness of right shoulder, not elsewhere classified: Secondary | ICD-10-CM

## 2017-05-28 NOTE — Therapy (Signed)
[facility] [address] [place], [place], [address] Phone: [phone]   Fax:  [phone]  Physical Therapy Treatment  Patient Details  Name: Jerry Wallace MRN: [id number] Date of Birth: 17-Feb-1941 Referring Provider: Schuyler Amor, MD  Encounter Date: 05/28/2017      PT End of Session - 05/28/17 1756    Visit Number 3   Number of Visits 8   Date for PT Re-Evaluation 06/17/17   Authorization Type medicare   Authorization - Visit Number 3   Authorization - Number of Visits 10   PT Start Time 1541   PT Stop Time 1632   PT Time Calculation (min) 51 min   Activity Tolerance Patient tolerated treatment well;Patient limited by pain   Behavior During Therapy [facility] for tasks assessed/performed      Past Medical History:  Diagnosis Date  . Coronary artery disease   . Diabetes mellitus without complication (HCC)   . Hypertension     Past Surgical History:  Procedure Laterality Date  . CARDIAC SURGERY     CABG 1996  . CARPAL TUNNEL RELEASE      There were no vitals filed for this visit.      Subjective Assessment - 05/28/17 1749    Subjective Pt reports 7/10 in R hip and R shoulder, and reports increase in gout flare ups. Pt states he is going to call his doctor to see if he needs to increase medication. Pt reports seeing PA this morning re shoulder and hip pain. Xrays of R hip showed OA and pt states PA reported possible need of hip replacement. Pt also states PA is ordering MRI for R shoulder due to suspicion of rotator cuff tear.   Limitations Lifting;Standing;Walking;Writing;House hold activities   Patient Stated Goals Increase R shoulder/ hip ROM and strengthn to improve pain-free mobility.     Currently in Pain? Yes   Pain Score 7    Pain Location Shoulder   Pain Orientation Right   Pain Descriptors / Indicators Aching   Pain Type Chronic pain   Pain Onset More than a month ago   Pain Frequency Constant   Multiple  Pain Sites Yes   Pain Score 7   Pain Location Hip   Pain Orientation Right   Pain Descriptors / Indicators Aching   Pain Type Chronic pain   Pain Onset More than a month ago   Pain Frequency Constant   Pain Location --   Pain Orientation --   Pain Descriptors / Indicators --   Pain Type --   Pain Onset --   Pain Frequency --     R hip treatment:   Manual: Hamstring stretches 3x30s BLE Hip distraction mobilization on R 3x30s   TherEx: Hamstring stretches on step 2x30s BLE Supine marching x15 BLE   R shoulder treatment:   Manual: AP mobs with distraction grade II to R shoulder 45deg flexion Inferior glides with distraction grade II to R shoulder 60deg flexion   TherEx: AAROM with wand: flexion, abduction, ER 2x15 each direction  Ice pack to R shoulder during R hip tx   Pt reports decrease in pain in R shoulder and R hip from 7/10 to 6/10 at end of tx session          PT Education - 05/28/17 1756    Education provided Yes   Education Details continuance of motion to decrease symptoms   Person(s) Educated Patient   Methods Explanation   Comprehension  Verbalized understanding             PT Long Term Goals - 05/20/17 1911      PT LONG TERM GOAL #1   Title Pt. will report worse pain of 6/10 in both R shoulder and R hip in order to improve quality of life   Baseline Worse pain in both is 9/10 on 05/20/17   Time 4   Period Weeks   Status New     PT LONG TERM GOAL #2   Title Pt. will decrease QuickDASH score to <40% in order to increase function of R shoulder   Baseline 57% on 7/23   Time 4   Period Weeks   Status New     PT LONG TERM GOAL #3   Title Pt. will increase LEFS score 27/80 in order to increase function of R hip   Baseline 17/80 on 05/20/17   Time 4   Period Weeks   Status New     PT LONG TERM GOAL #4   Title Pt. will increase BLE strength to 4+/5 in order to participate in desired activities   Baseline 3+/5 hip flexion, 4/5 knee  flexion and extension on 05/20/17   Time 4   Period Weeks   Status New               Plan - 05/28/17 1757    Clinical Impression Statement Pt demonstrates 7/10 R hip and shoulder pain, with significant lack of PROM and AROM shoulder > hip. Pt demonstrates moderate pain with R shoulder AAROM with wand but reports exercise has become easier for him to perform. Pt continues to demonstrate pain around 90 degrees of R shoulder flexion and eases with return to neutral. Pt reports decrease in pain with mobilizations with distraction, but pain returns with decrease in joint distraction. Pt demonstrates ability to march in supine without increase in R hip pain. Pt reports decrease in pain from 7/10 to 6/10 at end of tx session.   Clinical Presentation Stable   Clinical Decision Making Moderate   Rehab Potential Fair   PT Frequency 2x / week   PT Duration 4 weeks   PT Treatment/Interventions ADLs/Self Care Home Management;Aquatic Therapy;Cryotherapy;Electrical Stimulation;Moist Heat;Therapeutic exercise;Therapeutic activities;Iontophoresis 4mg /ml Dexamethasone;Stair training;Gait training;Balance training;Neuromuscular re-education;Patient/family education;Manual techniques;Passive range of motion   PT Next Visit Plan manual techniques on hip and shoulder, range of motion   PT Home Exercise Plan see handout   Consulted and Agree with Plan of Care Patient      Patient will benefit from skilled therapeutic intervention in order to improve the following deficits and impairments:  Abnormal gait, Decreased activity tolerance, Decreased balance, Decreased mobility, Decreased strength, Decreased endurance, Decreased range of motion, Hypomobility, Difficulty walking, Increased fascial restricitons, Impaired flexibility, Postural dysfunction, Improper body mechanics, Pain  Visit Diagnosis: Chronic right shoulder pain  Pain in right hip  Decreased range of motion of right shoulder  Muscle weakness  (generalized)  Unsteadiness on feet  Decreased range of motion of hip     Problem List Patient Active Problem List   Diagnosis Date Noted  . Osteoarthritis 05/08/2017  . Vitamin D deficiency 05/08/2017  . Gait instability 05/08/2017  . OSA on CPAP 05/08/2017  . BPH (benign prostatic hyperplasia) 05/08/2017  . Erectile dysfunction 05/08/2017  . GERD (gastroesophageal reflux disease) 05/08/2017  . Pedal edema 05/08/2017  . Ventral hernia 05/08/2017  . Anal fissure 05/08/2017  . Hx of cervical spine surgery 05/08/2017  . Current use  of proton pump inhibitor 05/08/2017  . Colon polyps 05/08/2017  . Internal hemorrhoids 05/08/2017  . Allergic rhinitis 05/08/2017  . Left-sided sensory deficit present   . TIA (transient ischemic attack) 02/25/2016  . Type 2 diabetes, controlled, with neuropathy (HCC) 02/25/2016  . Essential hypertension 02/25/2016  . 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 Gout 02/25/2016  . Hyperlipidemia 02/25/2016  . Lens replaced 09/19/2012  . 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 Obesity 01/15/2009   Cammie McgeeMichael C Sherk, PT, DPT # [id number] Nickola MajorKara Soraya Paquette, SPT 05/28/2017, 6:16 PM  [facility] [address] [place], [place], [address] Phone: [phone]   Fax:  [phone]  Name: Jerry Wallace MRN: [id number] Date of Birth: 10/14/1941

## 2017-06-03 ENCOUNTER — Other Ambulatory Visit: Payer: Self-pay | Admitting: Orthopedic Surgery

## 2017-06-03 DIAGNOSIS — M75101 Unspecified rotator cuff tear or rupture of right shoulder, not specified as traumatic: Secondary | ICD-10-CM

## 2017-06-03 DIAGNOSIS — M25311 Other instability, right shoulder: Secondary | ICD-10-CM

## 2017-06-03 DIAGNOSIS — M19011 Primary osteoarthritis, right shoulder: Secondary | ICD-10-CM

## 2017-06-05 ENCOUNTER — Ambulatory Visit: Payer: Medicare HMO | Admitting: Family Medicine

## 2017-06-11 ENCOUNTER — Encounter: Payer: Medicare HMO | Admitting: Physical Therapy

## 2017-06-12 ENCOUNTER — Ambulatory Visit: Payer: Medicare HMO | Admitting: Family Medicine

## 2017-06-13 ENCOUNTER — Encounter: Payer: Self-pay | Admitting: Physical Therapy

## 2017-06-13 ENCOUNTER — Ambulatory Visit: Payer: Medicare HMO | Attending: Family Medicine | Admitting: Physical Therapy

## 2017-06-13 DIAGNOSIS — G8929 Other chronic pain: Secondary | ICD-10-CM | POA: Diagnosis present

## 2017-06-13 DIAGNOSIS — M25551 Pain in right hip: Secondary | ICD-10-CM | POA: Diagnosis present

## 2017-06-13 DIAGNOSIS — R2681 Unsteadiness on feet: Secondary | ICD-10-CM

## 2017-06-13 DIAGNOSIS — M25511 Pain in right shoulder: Secondary | ICD-10-CM | POA: Diagnosis not present

## 2017-06-13 DIAGNOSIS — M25611 Stiffness of right shoulder, not elsewhere classified: Secondary | ICD-10-CM | POA: Diagnosis present

## 2017-06-13 DIAGNOSIS — M25659 Stiffness of unspecified hip, not elsewhere classified: Secondary | ICD-10-CM | POA: Diagnosis present

## 2017-06-13 DIAGNOSIS — M6281 Muscle weakness (generalized): Secondary | ICD-10-CM

## 2017-06-13 NOTE — Therapy (Signed)
[facility] [address] [place], [place], [address] Phone: [phone]   Fax:  [phone]  Physical Therapy Treatment  Patient Details  Name: Jerry Wallace MRN: [id number] Date of Birth: 1940-11-29 Referring Provider: Adline Potter, MD  Encounter Date: 06/13/2017      PT End of Session - 06/13/17 1817    Visit Number 4   Number of Visits 8   Date for PT Re-Evaluation 06/17/17   Authorization Type medicare   Authorization - Visit Number 4   Authorization - Number of Visits 10   PT Start Time 4174   PT Stop Time 1541   PT Time Calculation (min) 49 min   Activity Tolerance Patient tolerated treatment well;Patient limited by pain   Behavior During Therapy [facility] for tasks assessed/performed      Past Medical History:  Diagnosis Date  . Coronary artery disease   . Diabetes mellitus without complication ([place])   . Hypertension     Past Surgical History:  Procedure Laterality Date  . CARDIAC SURGERY     CABG 1996  . CARPAL TUNNEL RELEASE      There were no vitals filed for this visit.      Subjective Assessment - 06/13/17 1815    Subjective Pt reports 5/10 R shoulder pain and 4/10 R hip pain, and reports he thinks he is doing better since beginning PT. Pt was able to drive to [place], [place] by himself without issue. Pt reports he has injection in his R hip tomorrow.   Limitations Lifting;Standing;Walking;Writing;House hold activities   Patient Stated Goals Increase R shoulder/ hip ROM and strengthn to improve pain-free mobility.     Currently in Pain? Yes   Pain Score 5    Pain Location Shoulder   Pain Orientation Right   Pain Descriptors / Indicators Aching   Pain Type Chronic pain   Pain Onset More than a month ago   Pain Frequency Constant   Multiple Pain Sites Yes   Pain Score 4   Pain Location Hip   Pain Orientation Right   Pain Descriptors / Indicators Aching   Pain Type Chronic pain   Pain Onset More  than a month ago   Pain Frequency Constant     QuickDASH: 65.9%  LEFS: 32/80  R hip treatment:   Manual: Hamstring stretches 3x30s BLE Hip distraction mobilization on R 3x30s   TherEx: Supine marching x15 BLE Heel slides x15 RLE Heel raises in // bars x10 Marching in // bars x15 Mini squats in // bars x20. Pt educated on proper technique   R shoulder treatment:   Manual: AP mobs with distraction grade II to R shoulder 60deg flexion Inferior glides with distraction grade II to R shoulder 80deg flexion   TherEx: AAROM with wand: flexion, abduction, ER 2x15 each direction. Abduction produces "severe" pain and is unable to abduct past 90deg     Pt reports decrease in pain in R hip pain from 6/10 to 5/10 at end of tx session with no change in R shoulder pain         PT Education - 06/13/17 1817    Education provided Yes   Education Details see handout; safety with HEP   Person(s) Educated Patient   Methods Explanation;Handout;Demonstration   Comprehension Verbalized understanding;Returned demonstration             PT Long Term Goals - 06/13/17 1825      PT LONG TERM GOAL #1  Title Pt. will report worse pain of 6/10 in both R shoulder and R hip in order to improve quality of life   Baseline Worse pain in both is 9/10 on 05/20/17; 7/10 on 06/13/17   Time 4   Period Weeks   Status Partially Met     PT LONG TERM GOAL #2   Title Pt. will decrease QuickDASH score to <40% in order to increase function of R shoulder   Baseline 57% on 7/23; 65.9% on 8/16   Time 4   Period Weeks   Status Not Met     PT LONG TERM GOAL #3   Title Pt. will increase LEFS score 27/80 in order to increase function of R hip   Baseline 17/80 on 05/20/17; 32/80 on 8/16   Time 4   Period Weeks   Status Achieved     PT LONG TERM GOAL #4   Title Pt. will increase BLE strength to 4+/5 in order to participate in desired activities   Baseline 3+/5 hip flexion, 4/5 knee flexion and  extension on 05/20/17   Time 4   Period Weeks   Status On-going               Plan - 06/13/17 1821    Clinical Impression Statement Pt reports to PT with decreased R hip and R shoulder pain since previous tx session, and demonstrates increased ability to tolerate therex with no increase in pain. Pt continues to demonstrate pain with abduction with AROM, AAROM, and PROM. Pt demonstrates increased function of BLE with LEFS score improving from 17/80 to 32/80, but does not show improvement in R shoulder pain/disability. Pt demonstrates confusion t/o tx session and on telephone when calling about appt time. Pt will continue to benefit from skilled PT in order to decrease pain in R shoulder and hip and increase strength in order to allow pt to participate in desired activities.   Clinical Presentation Stable   Clinical Decision Making Moderate   Rehab Potential Fair   PT Frequency 2x / week   PT Duration 4 weeks   PT Treatment/Interventions ADLs/Self Care Home Management;Aquatic Therapy;Cryotherapy;Electrical Stimulation;Moist Heat;Therapeutic exercise;Therapeutic activities;Iontophoresis 4m/ml Dexamethasone;Stair training;Gait training;Balance training;Neuromuscular re-education;Patient/family education;Manual techniques;Passive range of motion   PT Next Visit Plan manual techniques on hip and shoulder, range of motion   PT Home Exercise Plan see handout   Consulted and Agree with Plan of Care Patient      Patient will benefit from skilled therapeutic intervention in order to improve the following deficits and impairments:  Abnormal gait, Decreased activity tolerance, Decreased balance, Decreased mobility, Decreased strength, Decreased endurance, Decreased range of motion, Hypomobility, Difficulty walking, Increased fascial restricitons, Impaired flexibility, Postural dysfunction, Improper body mechanics, Pain  Visit Diagnosis: Chronic right shoulder pain  Pain in right hip  Decreased  range of motion of right shoulder  Muscle weakness (generalized)  Unsteadiness on feet  Decreased range of motion of hip     Problem List Patient Active Problem List   Diagnosis Date Noted  . Osteoarthritis 05/08/2017  . Vitamin D deficiency 05/08/2017  . Gait instability 05/08/2017  . OSA on CPAP 05/08/2017  . BPH (benign prostatic hyperplasia) 05/08/2017  . Erectile dysfunction 05/08/2017  . GERD (gastroesophageal reflux disease) 05/08/2017  . Pedal edema 05/08/2017  . Ventral hernia 05/08/2017  . Anal fissure 05/08/2017  . Hx of cervical spine surgery 05/08/2017  . Current use of proton pump inhibitor 05/08/2017  . Colon polyps 05/08/2017  . Internal hemorrhoids  05/08/2017  . Allergic rhinitis 05/08/2017  . Left-sided sensory deficit present   . TIA (transient ischemic attack) 02/25/2016  . Type 2 diabetes, controlled, with neuropathy ([place]) 02/25/2016  . Essential hypertension 02/25/2016  . 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 Gout 02/25/2016  . Hyperlipidemia 02/25/2016  . Lens replaced 09/19/2012  . 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 Obesity 01/15/2009   Pura Spice, PT, DPT # [id number] Betsy Coder, SPT 06/14/2017, 4:06 PM  [facility] [address] [place], [place], [address] Phone: [phone]   Fax:  [phone]  Name: Jerry Wallace MRN: [id number] Date of Birth: Jun 05, 1941

## 2017-06-17 ENCOUNTER — Ambulatory Visit: Payer: Medicare HMO | Admitting: Physical Therapy

## 2017-06-17 DIAGNOSIS — M6281 Muscle weakness (generalized): Secondary | ICD-10-CM

## 2017-06-17 DIAGNOSIS — M25511 Pain in right shoulder: Secondary | ICD-10-CM | POA: Diagnosis not present

## 2017-06-17 DIAGNOSIS — M25659 Stiffness of unspecified hip, not elsewhere classified: Secondary | ICD-10-CM

## 2017-06-17 DIAGNOSIS — M25551 Pain in right hip: Secondary | ICD-10-CM

## 2017-06-17 DIAGNOSIS — M25611 Stiffness of right shoulder, not elsewhere classified: Secondary | ICD-10-CM

## 2017-06-17 DIAGNOSIS — G8929 Other chronic pain: Secondary | ICD-10-CM

## 2017-06-17 DIAGNOSIS — R2681 Unsteadiness on feet: Secondary | ICD-10-CM

## 2017-06-18 ENCOUNTER — Encounter: Payer: Self-pay | Admitting: Physical Therapy

## 2017-06-18 NOTE — Therapy (Signed)
[facility] [address] [place], [place], [address] Phone: [phone]   Fax:  [phone]  Physical Therapy Treatment  Patient Details  Name: Jerry Wallace MRN: [id number] Date of Birth: [birth year] Referring Provider: Adline Potter, MD  Encounter Date: 06/17/2017      PT End of Session - 06/18/17 0741    Visit Number 5   Number of Visits 8   Date for PT Re-Evaluation 06/17/17   Authorization Type medicare   Authorization - Visit Number 5   Authorization - Number of Visits 10   PT Start Time 1601   PT Stop Time 0932   PT Time Calculation (min) 50 min   Equipment Utilized During Treatment Gait belt   Activity Tolerance Patient tolerated treatment well;Patient limited by pain      Past Medical History:  Diagnosis Date  . Coronary artery disease   . Diabetes mellitus without complication ([place])   . Hypertension     Past Surgical History:  Procedure Laterality Date  . CARDIAC SURGERY     CABG 1996  . CARPAL TUNNEL RELEASE      There were no vitals filed for this visit.      Subjective Assessment - 06/18/17 0738    Subjective Pt reports 5/10 R shoulder and hip pain, and reports he did not get out of his house at all this weekend because he "didn't feel like it." Pt reports he was unable to receive cortisone injection for his R hip because he could not find the building.    Limitations Lifting;Standing;Walking;Writing;House hold activities   Patient Stated Goals Increase R shoulder/ hip ROM and strengthn to improve pain-free mobility.     Currently in Pain? Yes   Pain Score 5    Pain Location Shoulder   Pain Orientation Right   Pain Descriptors / Indicators Aching   Pain Type Chronic pain   Pain Onset More than a month ago   Pain Frequency Constant   Multiple Pain Sites Yes   Pain Score 5   Pain Location Hip   Pain Orientation Right   Pain Descriptors / Indicators Aching   Pain Type Chronic pain   Pain Onset  More than a month ago   Pain Frequency Constant     R hip treatment:   Manual: Hamstring stretches 3x30s BLE Hip distraction mobilization on R 3x30s   TherEx: Supine marching x15 BLE Heel raises in // bars x10 Marching in // bars x15 Mini squats in // bars x20. Pt required verbal cueing and demonstration for proper technique   R shoulder treatment:   Manual: AP mobs with distraction grade II to R shoulder 60deg flexion Inferior glides with distraction grade II to R shoulder 80deg flexion PROM abduction x10. Pt demonstrates increase in PROM but continues to report significant pain   TherEx: AAROM with wand: flexion, abduction, ER 2x15 each direction. Pt continues to require tactile cueing and demonstration to perform AAROM exercises correctly      Pt reports 6/10 R hip and R shoulder pain at end of tx session          PT Education - 06/18/17 0741    Education provided Yes   Education Details importance of continued motion to decrease pain   Person(s) Educated Patient   Methods Explanation   Comprehension Verbalized understanding             PT Long Term Goals - 06/13/17 1825  PT LONG TERM GOAL #1   Title Pt. will report worse pain of 6/10 in both R shoulder and R hip in order to improve quality of life   Baseline Worse pain in both is 9/10 on 05/20/17; 7/10 on 06/13/17   Time 4   Period Weeks   Status Partially Met     PT LONG TERM GOAL #2   Title Pt. will decrease QuickDASH score to <40% in order to increase function of R shoulder   Baseline 57% on 7/23; 65.9% on 8/16   Time 4   Period Weeks   Status Not Met     PT LONG TERM GOAL #3   Title Pt. will increase LEFS score 27/80 in order to increase function of R hip   Baseline 17/80 on 05/20/17; 32/80 on 8/16   Time 4   Period Weeks   Status Achieved     PT LONG TERM GOAL #4   Title Pt. will increase BLE strength to 4+/5 in order to participate in desired activities   Baseline 3+/5 hip flexion,  4/5 knee flexion and extension on 05/20/17   Time 4   Period Weeks   Status On-going            Plan - 06/18/17 0744    Clinical Impression Statement Pt reports to PT with 5/10 R shoulder and R hip pain, and demonstrates confusion via telephone call 30 min prior to appt wanting to reschedule his cortisone shot with [place]. PT informed pt to call MD giving shot to reschedule. Pt also reported he was going to be late for PT because of the drive to [place], but remembered that PT was in [place] after PT reminded him. Pt demonstrates increased shoulder abduction PROM but still reports significant pain. Pt also demonstrates ability to tolerate standing therex for R hip with no increase in pain. Pt reports at the end of the session he had 6/10 pain in both R shoulder and R hip.   Clinical Presentation Stable   Clinical Decision Making Moderate   Rehab Potential Fair   PT Frequency 2x / week   PT Duration 4 weeks   PT Treatment/Interventions ADLs/Self Care Home Management;Aquatic Therapy;Cryotherapy;Electrical Stimulation;Moist Heat;Therapeutic exercise;Therapeutic activities;Iontophoresis 88m/ml Dexamethasone;Stair training;Gait training;Balance training;Neuromuscular re-education;Patient/family education;Manual techniques;Passive range of motion   PT Next Visit Plan re-assess goals   PT Home Exercise Plan see handout   Consulted and Agree with Plan of Care Patient      Patient will benefit from skilled therapeutic intervention in order to improve the following deficits and impairments:  Abnormal gait, Decreased activity tolerance, Decreased balance, Decreased mobility, Decreased strength, Decreased endurance, Decreased range of motion, Hypomobility, Difficulty walking, Increased fascial restricitons, Impaired flexibility, Postural dysfunction, Improper body mechanics, Pain  Visit Diagnosis: Chronic right shoulder pain  Pain in right hip  Decreased range of motion of right shoulder  Muscle  weakness (generalized)  Unsteadiness on feet  Decreased range of motion of hip     Problem List Patient Active Problem List   Diagnosis Date Noted  . Osteoarthritis 05/08/2017  . Vitamin D deficiency 05/08/2017  . Gait instability 05/08/2017  . OSA on CPAP 05/08/2017  . BPH (benign prostatic hyperplasia) 05/08/2017  . Erectile dysfunction 05/08/2017  . GERD (gastroesophageal reflux disease) 05/08/2017  . Pedal edema 05/08/2017  . Ventral hernia 05/08/2017  . Anal fissure 05/08/2017  . Hx of cervical spine surgery 05/08/2017  . Current use of proton pump inhibitor 05/08/2017  . Colon polyps 05/08/2017  .  Internal hemorrhoids 05/08/2017  . Allergic rhinitis 05/08/2017  . Left-sided sensory deficit present   . TIA (transient ischemic attack) 02/25/2016  . Type 2 diabetes, controlled, with neuropathy ([place]) 02/25/2016  . Essential hypertension 02/25/2016  . 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 Gout 02/25/2016  . Hyperlipidemia 02/25/2016  . Lens replaced 09/19/2012  . 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 Obesity 01/15/2009   Pura Spice, PT, DPT # [id number] Betsy Coder, SPT 06/18/2017, 1:12 PM  [place] [address] [place], [place], [address] Phone: [phone]   Fax:  [phone]  Name: Marzell Isakson MRN: [id number] Date of Birth: January 08, 1941

## 2017-06-19 ENCOUNTER — Encounter: Payer: Self-pay | Admitting: Physical Therapy

## 2017-06-19 ENCOUNTER — Ambulatory Visit: Payer: Medicare HMO | Admitting: Physical Therapy

## 2017-06-19 DIAGNOSIS — M25511 Pain in right shoulder: Principal | ICD-10-CM

## 2017-06-19 DIAGNOSIS — R2681 Unsteadiness on feet: Secondary | ICD-10-CM

## 2017-06-19 DIAGNOSIS — M6281 Muscle weakness (generalized): Secondary | ICD-10-CM

## 2017-06-19 DIAGNOSIS — M25611 Stiffness of right shoulder, not elsewhere classified: Secondary | ICD-10-CM

## 2017-06-19 DIAGNOSIS — G8929 Other chronic pain: Secondary | ICD-10-CM

## 2017-06-19 DIAGNOSIS — M25659 Stiffness of unspecified hip, not elsewhere classified: Secondary | ICD-10-CM

## 2017-06-19 DIAGNOSIS — M25551 Pain in right hip: Secondary | ICD-10-CM

## 2017-06-19 NOTE — Therapy (Signed)
[facility] [address] [place], [place], [address] Phone: [phone]   Fax:  [phone]  Physical Therapy Treatment  Patient Details  Name: Jerry Wallace MRN: [id number] Date of Birth: April 05, 1941 Referring Provider: Adline Potter, MD  Encounter Date: 06/19/2017      PT End of Session - 06/19/17 1848    Visit Number 6   Number of Visits 16   Date for PT Re-Evaluation 07/17/17   Authorization Type medicare   Authorization - Visit Number 6   Authorization - Number of Visits 16   PT Start Time 1532   PT Stop Time 1612   PT Time Calculation (min) 40 min   Equipment Utilized During Treatment Gait belt   Activity Tolerance Patient tolerated treatment well;Patient limited by pain      Past Medical History:  Diagnosis Date  . Coronary artery disease   . Diabetes mellitus without complication ([place])   . Hypertension     Past Surgical History:  Procedure Laterality Date  . CARDIAC SURGERY     CABG 1996  . CARPAL TUNNEL RELEASE      There were no vitals filed for this visit.      Subjective Assessment - 06/19/17 1847    Subjective Pt reports 5/10 R hip pain and 7/10 R shoulder pain. Pt reports he feels his hip is doing better since beginning PT, but his shoulder pain has not changed.   Limitations Lifting;Standing;Walking;Writing;House hold activities   Patient Stated Goals Increase R shoulder/ hip ROM and strengthn to improve pain-free mobility.     Currently in Pain? Yes   Pain Score 7    Pain Location Shoulder   Pain Orientation Right   Pain Descriptors / Indicators Aching   Pain Type Chronic pain   Pain Onset More than a month ago   Pain Frequency Constant   Multiple Pain Sites Yes   Pain Score 5   Pain Location Hip   Pain Orientation Right   Pain Descriptors / Indicators Aching   Pain Type Chronic pain   Pain Onset More than a month ago   Pain Frequency Constant       R hip treatment:   Manual:  Hamstring stretches 3x30s BLE Hip distraction mobilization on R 3x30s   TherEx: Supine marching x15 BLE Supine SLRs x15 BLE   R shoulder treatment:   Manual: Inferior glides with distraction grade II to R shoulder 80deg flexion PROM abduction x10. Pt demonstrates increase in PROM, but continues to report pain   TherEx: AAROM with wand: flexion and abduction. Pt demonstrates increased abduction range, but continues to report pain     Pt reports no change in pain at the end of tx session         PT Education - 06/19/17 1848    Education provided Yes   Education Details plan of care   Person(s) Educated Patient   Methods Explanation   Comprehension Verbalized understanding             PT Long Term Goals - 06/19/17 1851      PT LONG TERM GOAL #1   Title Pt. will report worse pain of 6/10 in both R shoulder and R hip in order to improve quality of life   Baseline Worse pain in both is 9/10 on 05/20/17; 7/10 on 06/13/17   Time 4   Period Weeks   Status Partially Met     PT LONG TERM GOAL #2  Title Pt. will decrease QuickDASH score to <40% in order to increase function of R shoulder   Baseline 57% on 7/23; 65.9% on 8/16   Time 4   Period Weeks   Status Not Met     PT LONG TERM GOAL #3   Title Pt. will increase LEFS score 27/80 in order to increase function of R hip   Baseline 17/80 on 05/20/17; 32/80 on 8/16   Time 4   Period Weeks   Status Achieved     PT LONG TERM GOAL #4   Title Pt. will increase BLE strength to 4+/5 in order to participate in desired activities   Baseline 3+/5 hip flexion, 4/5 knee flexion and extension on 05/20/17   Time 4   Period Weeks   Status On-going            Plan - 27-Jun-2017 1849    Clinical Impression Statement Pt reports to PT with 7/10 R shoulder pain and 5/10 R hip pain. Pt continues to demonstrate confusion via telephone call the morning of appt inquiring about time of appt, and then coming to appt at wrong time. Pt  also reports he has not called about the cortisone shot yet, but later said he did call but nobody answered. Pt continues to demonstrate increased shoulder abduction AAROM and PROM, but reports pain has not improved.    Clinical Presentation Stable   Clinical Decision Making Moderate   Rehab Potential Fair   PT Frequency 2x / week   PT Duration 4 weeks   PT Treatment/Interventions ADLs/Self Care Home Management;Aquatic Therapy;Cryotherapy;Electrical Stimulation;Moist Heat;Therapeutic exercise;Therapeutic activities;Iontophoresis 52m/ml Dexamethasone;Stair training;Gait training;Balance training;Neuromuscular re-education;Patient/family education;Manual techniques;Passive range of motion   PT Next Visit Plan Reassess/ progress HEP   PT Home Exercise Plan see handout   Consulted and Agree with Plan of Care Patient      Patient will benefit from skilled therapeutic intervention in order to improve the following deficits and impairments:  Abnormal gait, Decreased activity tolerance, Decreased balance, Decreased mobility, Decreased strength, Decreased endurance, Decreased range of motion, Hypomobility, Difficulty walking, Increased fascial restricitons, Impaired flexibility, Postural dysfunction, Improper body mechanics, Pain  Visit Diagnosis: Chronic right shoulder pain  Decreased range of motion of right shoulder  Muscle weakness (generalized)  Unsteadiness on feet  Decreased range of motion of hip  Pain in right hip       G-Codes - [id number]    Functional Assessment Tool Used (Outpatient Only) Clinical impression/ pain/ joint stiffness/ muscle weakness/ QuickDASH/ LEFS/ gait difficulty   Functional Limitation Mobility: Walking and moving around   Mobility: Walking and Moving Around Current Status ((T2671 At least 40 percent but less than 60 percent impaired, limited or restricted   Mobility: Walking and Moving Around Goal Status ((I4580 At least 20 percent but less than 40  percent impaired, limited or restricted      Problem List Patient Active Problem List   Diagnosis Date Noted  . Osteoarthritis 05/08/2017  . Vitamin D deficiency 05/08/2017  . Gait instability 05/08/2017  . OSA on CPAP 05/08/2017  . BPH (benign prostatic hyperplasia) 05/08/2017  . Erectile dysfunction 05/08/2017  . GERD (gastroesophageal reflux disease) 05/08/2017  . Pedal edema 05/08/2017  . Ventral hernia 05/08/2017  . Anal fissure 05/08/2017  . Hx of cervical spine surgery 05/08/2017  . Current use of proton pump inhibitor 05/08/2017  . Colon polyps 05/08/2017  . Internal hemorrhoids 05/08/2017  . Allergic rhinitis 05/08/2017  . Left-sided sensory deficit present   .  TIA (transient ischemic attack) 02/25/2016  . Type 2 diabetes, controlled, with neuropathy ([place]) 02/25/2016  . Essential hypertension 02/25/2016  . 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 Gout 02/25/2016  . Hyperlipidemia 02/25/2016  . Lens replaced 09/19/2012  . 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 Obesity 01/15/2009   Pura Spice, PT, DPT # [id number] Betsy Coder, SPT 06/20/2017, 7:39 AM  [place] [facility] [address] [place], [place], [address] Phone: [phone]   Fax:  [phone]  Name: Jerry Wallace MRN: [id number] Date of Birth: 01/09/1941

## 2017-06-24 ENCOUNTER — Ambulatory Visit: Payer: Medicare HMO | Admitting: Physical Therapy

## 2017-06-24 DIAGNOSIS — G8929 Other chronic pain: Secondary | ICD-10-CM

## 2017-06-24 DIAGNOSIS — M25659 Stiffness of unspecified hip, not elsewhere classified: Secondary | ICD-10-CM

## 2017-06-24 DIAGNOSIS — M25511 Pain in right shoulder: Secondary | ICD-10-CM | POA: Diagnosis not present

## 2017-06-24 DIAGNOSIS — M25611 Stiffness of right shoulder, not elsewhere classified: Secondary | ICD-10-CM

## 2017-06-24 DIAGNOSIS — R2681 Unsteadiness on feet: Secondary | ICD-10-CM

## 2017-06-24 DIAGNOSIS — M25551 Pain in right hip: Secondary | ICD-10-CM

## 2017-06-24 DIAGNOSIS — M6281 Muscle weakness (generalized): Secondary | ICD-10-CM

## 2017-06-25 NOTE — Therapy (Addendum)
[place] [facility] [address] [place], [place], [address] Phone: [phone]   Fax:  [phone]  Physical Therapy Treatment  Patient Details  Name: Jerry Wallace MRN: [id number] Date of Birth: 1940-12-30 Referring Provider: Adline Potter, MD  Encounter Date: 06/24/2017      PT End of Session - 06/25/17 1736    Visit Number 7   Number of Visits 16   Date for PT Re-Evaluation 07/17/17   Authorization Type medicare   Authorization - Visit Number 7   Authorization - Number of Visits 16   PT Start Time 5929   PT Stop Time 2446   PT Time Calculation (min) 46 min   Activity Tolerance Patient tolerated treatment well;Patient limited by pain   Behavior During Therapy [facility] for tasks assessed/performed      Past Medical History:  Diagnosis Date  . Coronary artery disease   . Diabetes mellitus without complication ([place])   . Hypertension     Past Surgical History:  Procedure Laterality Date  . CARDIAC SURGERY     CABG 1996  . CARPAL TUNNEL RELEASE      There were no vitals filed for this visit.      Subjective Assessment - 06/25/17 1733    Subjective Pt. c/o 6/10 R shoulder pain currently at rest.  Pt. states R hip "seems to be getting better".  Pt. reports taking ibuprofen everyday and has been lying around in bed most of today.  Pt. arrived >30 minutes late to tx. session.     Limitations Lifting;Standing;Walking;Writing;House hold activities   Patient Stated Goals Increase R shoulder/ hip ROM and strengthn to improve pain-free mobility.     Currently in Pain? Yes   Pain Score 6    Pain Location Shoulder   Pain Orientation Right   Pain Descriptors / Indicators Aching       There.ex.:  Standing/ supine R shoulder AROM (all planes) in pain tolerable range. Supine wand press-ups/ shoulder flexion/ ER 10x each with PT assist for verbal/tactile cuing. Discussed HEP  Manual tx.:  Supine R shoulder AA/PROM all planes 10x  each. R shoulder AP/PA/inf. Grade II-III mobs. 5x20 sec. Each.  STM to R UT/deltoid/ proximal biceps region Supine R shoulder AAROM: flexion 142 deg.              ER: 52 deg.  Ice to R shoulder in sitting posture after tx. Session.     Significant R shoulder hypomobility/ pain with flexion/ abd. >90 deg.  Pt. tends to guard R shoulder with AA/PROM and does better when in control with wand/pulley ex. in supine and sitting posture.        PT Long Term Goals - 06/19/17 1851      PT LONG TERM GOAL #1   Title Pt. will report worse pain of 6/10 in both R shoulder and R hip in order to improve quality of life   Baseline Worse pain in both is 9/10 on 05/20/17; 7/10 on 06/13/17   Time 4   Period Weeks   Status Partially Met     PT LONG TERM GOAL #2   Title Pt. will decrease QuickDASH score to <40% in order to increase function of R shoulder   Baseline 57% on 7/23; 65.9% on 8/16   Time 4   Period Weeks   Status Not Met     PT LONG TERM GOAL #3   Title Pt. will increase LEFS score 27/80 in order to  increase function of R hip   Baseline 17/80 on 05/20/17; 32/80 on 8/16   Time 4   Period Weeks   Status Achieved     PT LONG TERM GOAL #4   Title Pt. will increase BLE strength to 4+/5 in order to participate in desired activities   Baseline 3+/5 hip flexion, 4/5 knee flexion and extension on 05/20/17   Time 4   Period Weeks   Status On-going            Plan - 06/25/17 1737    Clinical Presentation Stable   Clinical Decision Making Moderate   Rehab Potential Fair   PT Frequency 2x / week   PT Duration 4 weeks   PT Treatment/Interventions ADLs/Self Care Home Management;Aquatic Therapy;Cryotherapy;Electrical Stimulation;Moist Heat;Therapeutic exercise;Therapeutic activities;Iontophoresis 73m/ml Dexamethasone;Stair training;Gait training;Balance training;Neuromuscular re-education;Patient/family education;Manual techniques;Passive range of motion   PT Next Visit Plan Discuss MD f/u  and date for injection/MRI? for R shoulder   PT Home Exercise Plan see handout   Consulted and Agree with Plan of Care Patient      Patient will benefit from skilled therapeutic intervention in order to improve the following deficits and impairments:  Abnormal gait, Decreased activity tolerance, Decreased balance, Decreased mobility, Decreased strength, Decreased endurance, Decreased range of motion, Hypomobility, Difficulty walking, Increased fascial restricitons, Impaired flexibility, Postural dysfunction, Improper body mechanics, Pain  Visit Diagnosis: Chronic right shoulder pain  Decreased range of motion of right shoulder  Muscle weakness (generalized)  Decreased range of motion of hip  Pain in right hip  Unsteadiness on feet     Problem List Patient Active Problem List   Diagnosis Date Noted  . Osteoarthritis 05/08/2017  . Vitamin D deficiency 05/08/2017  . Gait instability 05/08/2017  . OSA on CPAP 05/08/2017  . BPH (benign prostatic hyperplasia) 05/08/2017  . Erectile dysfunction 05/08/2017  . GERD (gastroesophageal reflux disease) 05/08/2017  . Pedal edema 05/08/2017  . Ventral hernia 05/08/2017  . Anal fissure 05/08/2017  . Hx of cervical spine surgery 05/08/2017  . Current use of proton pump inhibitor 05/08/2017  . Colon polyps 05/08/2017  . Internal hemorrhoids 05/08/2017  . Allergic rhinitis 05/08/2017  . Left-sided sensory deficit present   . TIA (transient ischemic attack) 02/25/2016  . Type 2 diabetes, controlled, with neuropathy ([place] 02/25/2016  . Essential hypertension 02/25/2016  . 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 Gout 02/25/2016  . Hyperlipidemia 02/25/2016  . Lens replaced 09/19/2012  . 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 Obesity 01/15/2009   MPura Spice PT, DPT # [phone]/28/2018, 5:38 PM  [place] [facility] [address]Phone: [phone]  Fax:   [phone] Name: Jerry MccanlessMRN: [id number] of Birth: [birth year]

## 2017-06-26 ENCOUNTER — Ambulatory Visit: Payer: Medicare HMO | Admitting: Physical Therapy

## 2017-06-26 DIAGNOSIS — R2681 Unsteadiness on feet: Secondary | ICD-10-CM

## 2017-06-26 DIAGNOSIS — G8929 Other chronic pain: Secondary | ICD-10-CM

## 2017-06-26 DIAGNOSIS — M25551 Pain in right hip: Secondary | ICD-10-CM

## 2017-06-26 DIAGNOSIS — M25611 Stiffness of right shoulder, not elsewhere classified: Secondary | ICD-10-CM

## 2017-06-26 DIAGNOSIS — M25511 Pain in right shoulder: Secondary | ICD-10-CM | POA: Diagnosis not present

## 2017-06-26 DIAGNOSIS — M25659 Stiffness of unspecified hip, not elsewhere classified: Secondary | ICD-10-CM

## 2017-06-26 DIAGNOSIS — M6281 Muscle weakness (generalized): Secondary | ICD-10-CM

## 2017-06-28 NOTE — Therapy (Addendum)
[place] [facility] [address] [place], [place], [address] Phone: [phone]   Fax:  [phone]  Physical Therapy Treatment  Patient Details  Name: Jerry Wallace MRN: [id number] Date of Birth: 20-Mar-1941 Referring Provider: Adline Potter, MD  Encounter Date: 06/26/2017      PT End of Session - 06/28/17 0727    Visit Number 8   Number of Visits 16   Date for PT Re-Evaluation 07/17/17   Authorization Type medicare   Authorization - Visit Number 8   Authorization - Number of Visits 16   PT Start Time 1529   PT Stop Time 1622   PT Time Calculation (min) 53 min   Activity Tolerance Patient tolerated treatment well;Patient limited by pain   Behavior During Therapy [facility] for tasks assessed/performed      Past Medical History:  Diagnosis Date  . Coronary artery disease   . Diabetes mellitus without complication ([place])   . Hypertension     Past Surgical History:  Procedure Laterality Date  . CARDIAC SURGERY     CABG 1996  . CARPAL TUNNEL RELEASE      There were no vitals filed for this visit.      Subjective Assessment - 06/28/17 0712    Subjective Pt. arrived 50 minutes late to PT appt. after PT gave pt. numerous reminders.  Pt. states his memory is not good and has a difficult time with daily appts.  Pt. still hasn't been able to schedule shoulder injection or MD f/u for MRI.  Pt. c/o 6/10 R shoulder pain prior to tx. session.     Limitations Lifting;Standing;Walking;Writing;House hold activities   Patient Stated Goals Increase R shoulder/ hip ROM and strengthn to improve pain-free mobility.     Currently in Pain? Yes   Pain Score 6    Pain Location Shoulder   Pain Orientation Right   Pain Descriptors / Indicators Aching   Pain Type Chronic pain       R hip treatment:  Manual: Hamstring/ piriformis/ ITB stretches 3x30s BLE Hip distraction mobilization on R 3x30s  TherEx: Supine marching x15 BLE Supine SLRs x15  BLE  R shoulder treatment:  Manual:  R shoulder AP/PA grade II-III Mobs. 5x20 sec. (as tolerated). Inferior glides with distraction grade II to R shoulder 80deg flexion AA/PROM abduction x 10 (as tolerated with static holds).   TherEx: Supine wand AAROM: flexion and abduction 30x each (warm-up). Pt. Remains pain limited.  Reviewed HEP in depth.         Pt. ambulates into PT clinic with R antalgic gait pattern and c/o moderate R shoulder pain with movement.  Pt. able to tolerate grade II-III R shoulder AP/PA/inf. mobs. in supine position after AAROM warm-up.   Min. resistance with R shoulder isometrics (pain tolerable range) with moderate cuing for proper technique/ hold time.  PT talked with pt. about contacting MD ASAP to schedule f/u and discuss having the previously scheduled injection that he missed.          PT Long Term Goals - 06/19/17 1851      PT LONG TERM GOAL #1   Title Pt. will report worse pain of 6/10 in both R shoulder and R hip in order to improve quality of life   Baseline Worse pain in both is 9/10 on 05/20/17; 7/10 on 06/13/17   Time 4   Period Weeks   Status Partially Met     PT LONG TERM GOAL #2  Title Pt. will decrease QuickDASH score to <40% in order to increase function of R shoulder   Baseline 57% on 7/23; 65.9% on 8/16   Time 4   Period Weeks   Status Not Met     PT LONG TERM GOAL #3   Title Pt. will increase LEFS score 27/80 in order to increase function of R hip   Baseline 17/80 on 05/20/17; 32/80 on 8/16   Time 4   Period Weeks   Status Achieved     PT LONG TERM GOAL #4   Title Pt. will increase BLE strength to 4+/5 in order to participate in desired activities   Baseline 3+/5 hip flexion, 4/5 knee flexion and extension on 05/20/17   Time 4   Period Weeks   Status On-going            Plan - 06/28/17 0728    Clinical Presentation Stable   Clinical Decision Making Moderate   Rehab Potential Fair   PT Frequency 2x / week    PT Duration 4 weeks   PT Treatment/Interventions ADLs/Self Care Home Management;Aquatic Therapy;Cryotherapy;Electrical Stimulation;Moist Heat;Therapeutic exercise;Therapeutic activities;Iontophoresis 51m/ml Dexamethasone;Stair training;Gait training;Balance training;Neuromuscular re-education;Patient/family education;Manual techniques;Passive range of motion   PT Next Visit Plan Discuss MD f/u and date for injection/MRI? for R shoulder.  R shoulder manaul tx./ pain mgmt.     PT Home Exercise Plan see handout   Consulted and Agree with Plan of Care Patient      Patient will benefit from skilled therapeutic intervention in order to improve the following deficits and impairments:  Abnormal gait, Decreased activity tolerance, Decreased balance, Decreased mobility, Decreased strength, Decreased endurance, Decreased range of motion, Hypomobility, Difficulty walking, Increased fascial restricitons, Impaired flexibility, Postural dysfunction, Improper body mechanics, Pain  Visit Diagnosis: Chronic right shoulder pain  Muscle weakness (generalized)  Decreased range of motion of right shoulder  Decreased range of motion of hip  Pain in right hip  Unsteadiness on feet     Problem List Patient Active Problem List   Diagnosis Date Noted  . Osteoarthritis 05/08/2017  . Vitamin D deficiency 05/08/2017  . Gait instability 05/08/2017  . OSA on CPAP 05/08/2017  . BPH (benign prostatic hyperplasia) 05/08/2017  . Erectile dysfunction 05/08/2017  . GERD (gastroesophageal reflux disease) 05/08/2017  . Pedal edema 05/08/2017  . Ventral hernia 05/08/2017  . Anal fissure 05/08/2017  . Hx of cervical spine surgery 05/08/2017  . Current use of proton pump inhibitor 05/08/2017  . Colon polyps 05/08/2017  . Internal hemorrhoids 05/08/2017  . Allergic rhinitis 05/08/2017  . Left-sided sensory deficit present   . TIA (transient ischemic attack) 02/25/2016  . Type 2 diabetes, controlled, with  neuropathy [place] 02/25/2016  . Essential hypertension 02/25/2016  . 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 Gout 02/25/2016  . Hyperlipidemia 02/25/2016  . Lens replaced 09/19/2012  . 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 Obesity 01/15/2009   MPura Spice PT, DPT # [phone]/31/2018, 7:30 AM  [place] [facility]MEaston [place] [address]Phone: [phone]  Fax:  [phone] Name: WSiler MavisMRN: [id number] of Birth: [birth year]

## 2017-07-02 ENCOUNTER — Ambulatory Visit: Payer: Medicare HMO | Attending: Family Medicine | Admitting: Physical Therapy

## 2017-07-02 ENCOUNTER — Encounter: Payer: Self-pay | Admitting: Physical Therapy

## 2017-07-02 DIAGNOSIS — M25551 Pain in right hip: Secondary | ICD-10-CM | POA: Insufficient documentation

## 2017-07-02 DIAGNOSIS — R2681 Unsteadiness on feet: Secondary | ICD-10-CM

## 2017-07-02 DIAGNOSIS — M25611 Stiffness of right shoulder, not elsewhere classified: Secondary | ICD-10-CM | POA: Insufficient documentation

## 2017-07-02 DIAGNOSIS — M25511 Pain in right shoulder: Secondary | ICD-10-CM | POA: Diagnosis not present

## 2017-07-02 DIAGNOSIS — M6281 Muscle weakness (generalized): Secondary | ICD-10-CM | POA: Insufficient documentation

## 2017-07-02 DIAGNOSIS — G8929 Other chronic pain: Secondary | ICD-10-CM | POA: Insufficient documentation

## 2017-07-02 DIAGNOSIS — M25659 Stiffness of unspecified hip, not elsewhere classified: Secondary | ICD-10-CM

## 2017-07-02 NOTE — Therapy (Signed)
[place] [facility] [address] [place], [place], [address] Phone: [phone]   Fax:  [phone]  Physical Therapy Treatment  Patient Details  Name: Jerry Wallace MRN: [id number] Date of Birth: 1941-09-18 Referring Provider: Adline Potter, MD  Encounter Date: 07/02/2017      PT End of Session - 07/02/17 1426    Visit Number 9   Number of Visits 16   Date for PT Re-Evaluation 07/17/17   Authorization Type medicare   Authorization - Visit Number 9   Authorization - Number of Visits 16   PT Start Time 1426   PT Stop Time 1510   PT Time Calculation (min) 44 min   Activity Tolerance Patient tolerated treatment well;Patient limited by pain   Behavior During Therapy [facility] for tasks assessed/performed      Past Medical History:  Diagnosis Date  . Coronary artery disease   . Diabetes mellitus without complication ([place])   . Hypertension     Past Surgical History:  Procedure Laterality Date  . CARDIAC SURGERY     CABG 1996  . CARPAL TUNNEL RELEASE      There were no vitals filed for this visit.      Subjective Assessment - 07/02/17 1429    Subjective Pt reports his hip is feeling better.  Pt reports he is waiting to hear back to schedule an MRI of his R shoulder as suggested by his MD.  Pt reports he has been using an ice pack on his R shoulder which seems to help.     Limitations Lifting;Standing;Walking;Writing;House hold activities   Patient Stated Goals Increase R shoulder/ hip ROM and strengthn to improve pain-free mobility.     Currently in Pain? Yes   Pain Score 5    Pain Location Hip   Pain Orientation Right   Pain Descriptors / Indicators Aching   Pain Type Chronic pain   Pain Onset More than a month ago   Multiple Pain Sites Yes   Pain Score 6   Pain Location Hip   Pain Orientation Right   Pain Descriptors / Indicators Aching   Pain Type Chronic pain   Pain Onset More than a month ago        TREATMENT  R  hip treatment:  Manual: Hamstring/ piriformis stretches 3x30s BLE  Hip distraction mobilization on R 3x30s  ?  TherEx:  Supine marching x30 BLE with cues for core activation throughout  Supine SLRs x15 BLE  ?  R shoulder treatment:  Manual:  R shoulder AP/PA grade II-III Mobs. 5x20 sec. (as tolerated).  Inferior glides with distraction grade II to R shoulder 80deg flexion  AA/PROM abduction x 10 (as tolerated).  ?  TherEx:  Supine wand AAROM: flexion and abduction 30x each             PT Education - 07/02/17 1426    Education provided Yes   Education Details Exercise technique; reasoning behind interventions; appropriate use of ice   Person(s) Educated Patient   Methods Explanation;Demonstration   Comprehension Verbalized understanding;Returned demonstration;Need further instruction             PT Long Term Goals - 06/19/17 1851      PT LONG TERM GOAL #1   Title Pt. will report worse pain of 6/10 in both R shoulder and R hip in order to improve quality of life   Baseline Worse pain in both is 9/10 on 05/20/17; 7/10 on 06/13/17  Time 4   Period Weeks   Status Partially Met     PT LONG TERM GOAL #2   Title Pt. will decrease QuickDASH score to <40% in order to increase function of R shoulder   Baseline 57% on 7/23; 65.9% on 8/16   Time 4   Period Weeks   Status Not Met     PT LONG TERM GOAL #3   Title Pt. will increase LEFS score 27/80 in order to increase function of R hip   Baseline 17/80 on 05/20/17; 32/80 on 8/16   Time 4   Period Weeks   Status Achieved     PT LONG TERM GOAL #4   Title Pt. will increase BLE strength to 4+/5 in order to participate in desired activities   Baseline 3+/5 hip flexion, 4/5 knee flexion and extension on 05/20/17   Time 4   Period Weeks   Status On-going               Plan - 07/02/17 1458    Clinical Impression Statement Pt reports overall his R hip pain has improved since beginning physical therapy.  He  tolerated R hip interventions well this date.  He reports pain with RUE and thus performed manual therapy in pt's tolerated range and instructed pt to perform R shoulder AAROM wand exercises in a painfree range.  He will benefit from continued skilled PT interventions for improved ROM, strength, and functional use of RLE and RUE.     Rehab Potential Fair   PT Frequency 2x / week   PT Duration 4 weeks   PT Treatment/Interventions ADLs/Self Care Home Management;Aquatic Therapy;Cryotherapy;Electrical Stimulation;Moist Heat;Therapeutic exercise;Therapeutic activities;Iontophoresis 74m/ml Dexamethasone;Stair training;Gait training;Balance training;Neuromuscular re-education;Patient/family education;Manual techniques;Passive range of motion   PT Next Visit Plan Discuss MD f/u and date for injection/MRI? for R shoulder.  R shoulder manaul tx./ pain mgmt.     PT Home Exercise Plan see handout   Consulted and Agree with Plan of Care Patient      Patient will benefit from skilled therapeutic intervention in order to improve the following deficits and impairments:  Abnormal gait, Decreased activity tolerance, Decreased balance, Decreased mobility, Decreased strength, Decreased endurance, Decreased range of motion, Hypomobility, Difficulty walking, Increased fascial restricitons, Impaired flexibility, Postural dysfunction, Improper body mechanics, Pain  Visit Diagnosis: Chronic right shoulder pain  Muscle weakness (generalized)  Decreased range of motion of right shoulder  Decreased range of motion of hip  Pain in right hip  Unsteadiness on feet     Problem List Patient Active Problem List   Diagnosis Date Noted  . Osteoarthritis 05/08/2017  . Vitamin D deficiency 05/08/2017  . Gait instability 05/08/2017  . OSA on CPAP 05/08/2017  . BPH (benign prostatic hyperplasia) 05/08/2017  . Erectile dysfunction 05/08/2017  . GERD (gastroesophageal reflux disease) 05/08/2017  . Pedal edema  05/08/2017  . Ventral hernia 05/08/2017  . Anal fissure 05/08/2017  . Hx of cervical spine surgery 05/08/2017  . Current use of proton pump inhibitor 05/08/2017  . Colon polyps 05/08/2017  . Internal hemorrhoids 05/08/2017  . Allergic rhinitis 05/08/2017  . Left-sided sensory deficit present   . TIA (transient ischemic attack) 02/25/2016  . Type 2 diabetes, controlled, with neuropathy ([place] 02/25/2016  . Essential hypertension 02/25/2016  . 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 Gout 02/25/2016  . Hyperlipidemia 02/25/2016  . Lens replaced 09/19/2012  . 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 Obesity 01/15/2009    ACollie SiadPT, DPT 07/02/2017,  3:09 PM  [place] [facility] [address] [place], [place], [address] Phone: [phone]   Fax:  [phone]  Name: Jerry Wallace MRN: [id number] Date of Birth: Oct 19, 1941

## 2017-07-08 ENCOUNTER — Ambulatory Visit: Payer: Medicare HMO | Admitting: Physical Therapy

## 2017-07-08 ENCOUNTER — Encounter: Payer: Self-pay | Admitting: Physical Therapy

## 2017-07-08 DIAGNOSIS — M25611 Stiffness of right shoulder, not elsewhere classified: Secondary | ICD-10-CM

## 2017-07-08 DIAGNOSIS — M25551 Pain in right hip: Secondary | ICD-10-CM

## 2017-07-08 DIAGNOSIS — M25511 Pain in right shoulder: Secondary | ICD-10-CM | POA: Diagnosis not present

## 2017-07-08 DIAGNOSIS — M6281 Muscle weakness (generalized): Secondary | ICD-10-CM

## 2017-07-08 DIAGNOSIS — R2681 Unsteadiness on feet: Secondary | ICD-10-CM

## 2017-07-08 DIAGNOSIS — G8929 Other chronic pain: Secondary | ICD-10-CM

## 2017-07-08 DIAGNOSIS — M25659 Stiffness of unspecified hip, not elsewhere classified: Secondary | ICD-10-CM

## 2017-07-08 NOTE — Therapy (Signed)
[place] [facility] [address] [place], [place], [address] Phone: [phone]   Fax:  [phone]  Physical Therapy Treatment  Patient Details  Name: Jerry Wallace MRN: [id number] Date of Birth: August 04, 1941 Referring Provider: Adline Potter, MD  Encounter Date: 07/08/2017      PT End of Session - 07/08/17 1421    Visit Number 10   Number of Visits 16   Date for PT Re-Evaluation 07/17/17   Authorization Type medicare   Authorization - Visit Number 10   Authorization - Number of Visits 16   PT Start Time 6568   PT Stop Time 1275   PT Time Calculation (min) 52 min   Activity Tolerance Patient tolerated treatment well;Patient limited by pain   Behavior During Therapy [facility] for tasks assessed/performed      Past Medical History:  Diagnosis Date  . Coronary artery disease   . Diabetes mellitus without complication ([place])   . Hypertension     Past Surgical History:  Procedure Laterality Date  . CARDIAC SURGERY     CABG 1996  . CARPAL TUNNEL RELEASE      There were no vitals filed for this visit.      Subjective Assessment - 07/08/17 1421    Subjective (P)  Pt. reports 6/10 R shoulder pain.  No new complaints.  Pt. reports hip is better.  Pt. entered PT with use of SPC and glove on R hand (nerve pain/ coldness).       Limitations Lifting;Standing;Walking;Writing;House hold activities   Patient Stated Goals Increase R shoulder/ hip ROM and strengthn to improve pain-free mobility.     Currently in Pain? (P)  Yes   Pain Score (P)  6    Pain Location (P)  Shoulder   Pain Orientation (P)  Right   Pain Descriptors / Indicators (P)  Aching      TREATMENT  R hip treatment:  Manual: Hamstring/ piriformis stretches 3x30s BLE  Hip distraction mobilization on R 3x30s  ?  R shoulder treatment:  Manual:  R shoulder AP/PA/inf. grade II-III Mobs. 5x20 sec. (as tolerated).  R shoulder LAD in supine position 5x. Supine R AA/PROM all  planes x 10 (pain tolerable).  ?  TherEx:  B UBE 3 min. F/b.   Standing wand AAROM: flexion/ IR/ ext./ ER and abduction 20x each (PT assist/cuing and mirror feedback).    Standing R shoulder flexion with L UEassist/ wand:  124 deg. (pain limited).   Supine R shoulder isometrics (all planes)- 5x each with 10 sec. Holds (cuing for proper technique).          PT Long Term Goals - 06/19/17 1851      PT LONG TERM GOAL #1   Title Pt. will report worse pain of 6/10 in both R shoulder and R hip in order to improve quality of life   Baseline Worse pain in both is 9/10 on 05/20/17; 7/10 on 06/13/17   Time 4   Period Weeks   Status Partially Met     PT LONG TERM GOAL #2   Title Pt. will decrease QuickDASH score to <40% in order to increase function of R shoulder   Baseline 57% on 7/23; 65.9% on 8/16   Time 4   Period Weeks   Status Not Met     PT LONG TERM GOAL #3   Title Pt. will increase LEFS score 27/80 in order to increase function of R hip   Baseline 17/80  on 05/20/17; 32/80 on 8/16   Time 4   Period Weeks   Status Achieved     PT LONG TERM GOAL #4   Title Pt. will increase BLE strength to 4+/5 in order to participate in desired activities   Baseline 3+/5 hip flexion, 4/5 knee flexion and extension on 05/20/17   Time 4   Period Weeks   Status On-going               Plan - 07/08/17 1422    Clinical Impression Statement Increase R shoulder AROM in seated posture after R shoulder stretches/ manual tx.  Pt. remains limited by R shoulder pain with most planes of movement.  Good R hip ROM in supine position with no increase c/o pain.     Clinical Presentation Stable   Clinical Decision Making Moderate   Rehab Potential Fair   PT Frequency 2x / week   PT Duration 4 weeks   PT Treatment/Interventions ADLs/Self Care Home Management;Aquatic Therapy;Cryotherapy;Electrical Stimulation;Moist Heat;Therapeutic exercise;Therapeutic activities;Iontophoresis 54m/ml Dexamethasone;Stair  training;Gait training;Balance training;Neuromuscular re-education;Patient/family education;Manual techniques;Passive range of motion   PT Next Visit Plan Discuss MD f/u and date for injection/MRI? for R shoulder.  R shoulder manaul tx./ pain mgmt.     PT Home Exercise Plan see handout   Consulted and Agree with Plan of Care Patient      Patient will benefit from skilled therapeutic intervention in order to improve the following deficits and impairments:  Abnormal gait, Decreased activity tolerance, Decreased balance, Decreased mobility, Decreased strength, Decreased endurance, Decreased range of motion, Hypomobility, Difficulty walking, Increased fascial restricitons, Impaired flexibility, Postural dysfunction, Improper body mechanics, Pain  Visit Diagnosis: Chronic right shoulder pain  Muscle weakness (generalized)  Decreased range of motion of right shoulder  Decreased range of motion of hip  Pain in right hip  Unsteadiness on feet     Problem List Patient Active Problem List   Diagnosis Date Noted  . Osteoarthritis 05/08/2017  . Vitamin D deficiency 05/08/2017  . Gait instability 05/08/2017  . OSA on CPAP 05/08/2017  . BPH (benign prostatic hyperplasia) 05/08/2017  . Erectile dysfunction 05/08/2017  . GERD (gastroesophageal reflux disease) 05/08/2017  . Pedal edema 05/08/2017  . Ventral hernia 05/08/2017  . Anal fissure 05/08/2017  . Hx of cervical spine surgery 05/08/2017  . Current use of proton pump inhibitor 05/08/2017  . Colon polyps 05/08/2017  . Internal hemorrhoids 05/08/2017  . Allergic rhinitis 05/08/2017  . Left-sided sensory deficit present   . TIA (transient ischemic attack) 02/25/2016  . Type 2 diabetes, controlled, with neuropathy ([place] 02/25/2016  . Essential hypertension 02/25/2016  . 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 Gout 02/25/2016  . Hyperlipidemia 02/25/2016  . Lens replaced 09/19/2012  . 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  Obesity 01/15/2009   MPura Spice PT, DPT # [phone]/08/2017, 6:29 PM  [place] [facility] [address]Phone: [phone]  Fax:  [phone] Name: Jerry HockerMRN: [id number] of Birth: [birth year]

## 2017-07-10 ENCOUNTER — Encounter: Payer: Medicare HMO | Admitting: Physical Therapy

## 2017-10-04 ENCOUNTER — Other Ambulatory Visit: Payer: Self-pay

## 2017-10-04 ENCOUNTER — Encounter: Payer: Self-pay | Admitting: Emergency Medicine

## 2017-10-04 ENCOUNTER — Ambulatory Visit (INDEPENDENT_AMBULATORY_CARE_PROVIDER_SITE_OTHER): Payer: Medicare HMO

## 2017-10-04 ENCOUNTER — Ambulatory Visit
Admission: EM | Admit: 2017-10-04 | Discharge: 2017-10-04 | Disposition: A | Discharge status: Home / Self Care | Payer: Medicare HMO | Attending: Family Medicine | Admitting: Family Medicine

## 2017-10-04 DIAGNOSIS — E785 Hyperlipidemia, unspecified: Secondary | ICD-10-CM | POA: Diagnosis not present

## 2017-10-04 DIAGNOSIS — M109 Gout, unspecified: Secondary | ICD-10-CM | POA: Insufficient documentation

## 2017-10-04 DIAGNOSIS — G4733 Obstructive sleep apnea (adult) (pediatric): Secondary | ICD-10-CM | POA: Insufficient documentation

## 2017-10-04 DIAGNOSIS — N400 Enlarged prostate without lower urinary tract symptoms: Secondary | ICD-10-CM | POA: Insufficient documentation

## 2017-10-04 DIAGNOSIS — K219 Gastro-esophageal reflux disease without esophagitis: Secondary | ICD-10-CM | POA: Insufficient documentation

## 2017-10-04 DIAGNOSIS — E559 Vitamin D deficiency, unspecified: Secondary | ICD-10-CM | POA: Insufficient documentation

## 2017-10-04 DIAGNOSIS — E1140 Type 2 diabetes mellitus with diabetic neuropathy, unspecified: Secondary | ICD-10-CM | POA: Insufficient documentation

## 2017-10-04 DIAGNOSIS — M25559 Pain in unspecified hip: Secondary | ICD-10-CM

## 2017-10-04 DIAGNOSIS — M1990 Unspecified osteoarthritis, unspecified site: Secondary | ICD-10-CM | POA: Diagnosis not present

## 2017-10-04 DIAGNOSIS — I25810 Atherosclerosis of coronary artery bypass graft(s) without angina pectoris: Secondary | ICD-10-CM | POA: Diagnosis not present

## 2017-10-04 DIAGNOSIS — N529 Male erectile dysfunction, unspecified: Secondary | ICD-10-CM | POA: Insufficient documentation

## 2017-10-04 DIAGNOSIS — Z7984 Long term (current) use of oral hypoglycemic drugs: Secondary | ICD-10-CM | POA: Diagnosis not present

## 2017-10-04 DIAGNOSIS — I10 Essential (primary) hypertension: Secondary | ICD-10-CM | POA: Diagnosis not present

## 2017-10-04 DIAGNOSIS — M25551 Pain in right hip: Secondary | ICD-10-CM

## 2017-10-04 DIAGNOSIS — M7061 Trochanteric bursitis, right hip: Secondary | ICD-10-CM | POA: Diagnosis present

## 2017-10-04 DIAGNOSIS — K439 Ventral hernia without obstruction or gangrene: Secondary | ICD-10-CM | POA: Insufficient documentation

## 2017-10-04 DIAGNOSIS — Z79899 Other long term (current) drug therapy: Secondary | ICD-10-CM | POA: Insufficient documentation

## 2017-10-04 DIAGNOSIS — Z7982 Long term (current) use of aspirin: Secondary | ICD-10-CM | POA: Diagnosis not present

## 2017-10-04 DIAGNOSIS — G459 Transient cerebral ischemic attack, unspecified: Secondary | ICD-10-CM | POA: Insufficient documentation

## 2017-10-04 LAB — GLUCOSE, CAPILLARY: GLUCOSE-CAPILLARY: 204 mg/dL — AB (ref 65–99)

## 2017-10-04 NOTE — Discharge Instructions (Addendum)
Tylenol/Motrin as needed for pain.  Recommended to FU with PCP/Ortho for further evaluation and management of Hip Arthritis.

## 2017-10-04 NOTE — ED Triage Notes (Signed)
Patient c/o right hip pain for a month.  Patient states that his PCP says he has arthritis in his hip.

## 2017-10-04 NOTE — ED Provider Notes (Addendum)
[facility]    CSN: [id number] Arrival date & time: 10/04/17  1212     History   Chief Complaint Chief Complaint  Patient presents with  . Hip Pain    right    HPI Jerry Wallace is a 76 y.o. male presented to clinic with CC of right hip joint pain x 1 month, gradually worsening. Pain worse with walking and weight bearing, radiates down the right leg. No visible deformity, swelling, bruising, erythema noted. Pt is extremely uncomfortable due to pain. Denies injury/trauma/fall. Seen PCP for the same. XR was done, do not know the results. Taking Ibuprofen/Tylenol with minimal relief in Sx.  The history is provided by the patient. The history is limited by a language barrier.  Hip Pain  This is a new problem. The current episode started more than 1 week ago (month ago). The problem has been gradually worsening. The symptoms are aggravated by walking and standing. The symptoms are relieved by rest.    Past Medical History:  Diagnosis Date  . Coronary artery disease   . Diabetes mellitus without complication (HCC)   . Hypertension     Patient Active Problem List   Diagnosis Date Noted  . Osteoarthritis 05/08/2017  . Vitamin D deficiency 05/08/2017  . Gait instability 05/08/2017  . OSA on CPAP 05/08/2017  . BPH (benign prostatic hyperplasia) 05/08/2017  . Erectile dysfunction 05/08/2017  . GERD (gastroesophageal reflux disease) 05/08/2017  . Pedal edema 05/08/2017  . Ventral hernia 05/08/2017  . Anal fissure 05/08/2017  . Hx of cervical spine surgery 05/08/2017  . Current use of proton pump inhibitor 05/08/2017  . Colon polyps 05/08/2017  . Internal hemorrhoids 05/08/2017  . Allergic rhinitis 05/08/2017  . Left-sided sensory deficit present   . TIA (transient ischemic attack) 02/25/2016  . Type 2 diabetes, controlled, with neuropathy (HCC) 02/25/2016  . Essential hypertension 02/25/2016  . 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 Gout  02/25/2016  . Hyperlipidemia 02/25/2016  . Lens replaced 09/19/2012  . 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 Obesity 01/15/2009    Past Surgical History:  Procedure Laterality Date  . CARDIAC SURGERY     CABG 1996  . CARPAL TUNNEL RELEASE         Home Medications    Prior to Admission medications   Medication Sig Start Date End Date Taking? Authorizing Provider  acetaminophen (TYLENOL) 500 MG tablet Take 2 tablets (1,000 mg total) by mouth 2 (two) times daily. 05/08/17  Yes Plonk, Chrissie NoaWilliam, MD  allopurinol (ZYLOPRIM) 300 MG tablet Take 300 mg by mouth daily.   Yes [provider]  aspirin EC 81 MG tablet Take 81 mg by mouth daily.   Yes [provider]  Cholecalciferol (VITAMIN D) 2000 units CAPS Take 1 capsule (2,000 Units total) by mouth daily. 05/09/17  Yes Plonk, Chrissie NoaWilliam, MD  furosemide (LASIX) 20 MG tablet Take 20 mg by mouth 2 (two) times daily.   Yes [provider]  gabapentin (NEURONTIN) 400 MG capsule Take 800 mg by mouth 2 (two) times daily.   Yes [provider]  metFORMIN (GLUCOPHAGE) 1000 MG tablet Take 1,000 mg by mouth 2 (two) times daily. 06/16/09  Yes [provider]  metoprolol tartrate (LOPRESSOR) 50 MG tablet Take 25 mg by mouth 2 (two) times daily. 05/09/09  Yes [provider]  Multiple Vitamins-Minerals (MULTIVITAMIN WITH MINERALS) tablet Take 1 tablet by mouth. 06/16/09  Yes [provider]  nitroGLYCERIN (NITROSTAT) 0.4 MG SL tablet  Place 0.4 mg under the tongue every 5 (five) minutes as needed for chest pain.    Yes [provider]  pantoprazole (PROTONIX) 40 MG tablet Take 40 mg by mouth daily.   Yes [provider]  pravastatin (PRAVACHOL) 40 MG tablet Take 1 tablet (40 mg total) by mouth daily. 05/09/17  Yes Plonk, Chrissie NoaWilliam, MD  solifenacin (VESICARE) 5 MG tablet Take 5 mg by mouth. 01/08/17 01/08/18 Yes [provider]  docusate sodium (COLACE) 50 MG capsule Take 100 mg by mouth 2  (two) times daily.    [provider]  Hypromellose 0.4 % SOLN Frequency:PHARMDIR   Dosage:0.0     Instructions:  Note:instill 1 drop in both eyes QID Dose: 1 05/09/09   [provider]  lidocaine (LIDODERM) 5 % Place 1 patch onto the skin at bedtime as needed.    [provider]  loratadine (CLARITIN) 10 MG tablet Take 10 mg by mouth daily.    [provider]  phenylephrine-shark liver oil-mineral oil-petrolatum (PREPARATION H) 0.25-3-14-71.9 % rectal ointment Place 1 application rectally at bedtime as needed.    [provider]  tadalafil (CIALIS) 5 MG tablet Take 5 mg by mouth daily.    [provider]  traMADol (ULTRAM) 50 MG tablet Take 50 mg by mouth.    [provider]    Family History History reviewed. No pertinent family history.  Social History Social History   Tobacco Use  . Smoking status: Never Smoker  . Smokeless tobacco: Never Used  Substance Use Topics  . Alcohol use: Yes    Comment: occasional   . Drug use: No     Allergies   Penicillin g; Lisinopril; Penicillins; Pseudoephedrine hcl; Dimetapp cold-allergy [brompheniramine-phenylephrine]; and Triprolidine-pseudoephedrine   Review of Systems Review of Systems  Constitutional: Negative.   HENT: Negative.   Respiratory: Negative.   Cardiovascular: Negative.   Musculoskeletal: Positive for arthralgias (Rt hip pain x 1 month. ).  Skin: Negative.   Neurological: Negative.      Physical Exam Triage Vital Signs ED Triage Vitals  Enc Vitals Group     BP 10/04/17 1227 119/61     Pulse Rate 10/04/17 1227 73     Resp 10/04/17 1227 16     Temp 10/04/17 1227 97.8 F (36.6 C)     Temp Source 10/04/17 1227 Oral     SpO2 10/04/17 1227 99 %     Weight 10/04/17 1223 240 lb (108.9 kg)     Height 10/04/17 1223 6\' 1"  (1.854 m)     Head Circumference --      Peak Flow --      Pain Score 10/04/17 1224 8     Pain Loc --      Pain Edu? --      Excl. in  GC? --    No data found.  Updated Vital Signs BP 119/61 (BP Location: Left Arm)   Pulse 73   Temp 97.8 F (36.6 C) (Oral)   Resp 16   Ht 6\' 1"  (1.854 m)   Wt 240 lb (108.9 kg)   SpO2 99%   BMI 31.66 kg/m   Visual Acuity Right Eye Distance:   Left Eye Distance:   Bilateral Distance:    Right Eye Near:   Left Eye Near:    Bilateral Near:     Physical Exam  Constitutional: He is oriented to person, place, and time. He appears well-developed and well-nourished. No distress.  HENT:  Head:  Normocephalic.  Eyes: Pupils are equal, round, and reactive to light.  Neck: Normal range of motion.  Cardiovascular: Normal rate, regular rhythm and normal heart sounds.  Pulmonary/Chest: Effort normal and breath sounds normal.  Musculoskeletal: He exhibits tenderness (Right hip joint pain, worse with walking and weight bearing. No visible swelling, bruising. ). He exhibits no edema.  Neurological: He is alert and oriented to person, place, and time.  Skin: Skin is warm.   Pt reported lightheadedness while going for hip Xray. ZO:109. Crackers and Gingerale provided. Pt A&Ox3.Highly  Likely from pain.Pt also stated pain is making him feel lightheaded. No acute visible distress.  XR: Degenerative changes of the lumbar spine and hip joints are noted.Pelvic ring is intact. No acute fracture is noted. Bursa joint Injection offered. Risk and benefits discussed. Pt verbally consented on getting Rt Trochanteric bursa injection.   UC Treatments / Results  Labs (all labs ordered are listed, but only abnormal results are displayed) Labs Reviewed - No data to display  EKG  EKG Interpretation None       Radiology No results found.  Procedures Join Aspiration/Injection Date/Time: 10/04/2017 2:17 PM Performed by: Reinaldo Raddle, NP Authorized by: Payton Mccallum, MD   Consent:    Consent obtained:  Verbal   Consent given by:  Patient   Risks discussed:  Bleeding, infection, pain and  nerve damage   Alternatives discussed:  Referral (Pt reports do not want to wait to see specialist as of worsening pain. ) Location:    Location:  Hip   Hip:  R hip joint (Rt hip bursa ) Anesthesia (see MAR for exact dosages):    Anesthesia method:  None Procedure details:    Preparation: Patient was prepped and draped in usual sterile fashion     Needle gauge: 25G 1.5"   Ultrasound guidance: no     Approach:  Superior Post-procedure details:    Dressing:  Adhesive bandage   Patient tolerance of procedure:  Tolerated well, no immediate complications Comments:     Rt Hip Bursa Injection with 2 CC 1% Lidocaine + 2 CC 0.5 % Bupivacaine + 1 CC kenalog. 25 G 1.5 " needle used. Pt tolerated procedure well.    (including critical care time)  Medications Ordered in UC Medications - No data to display   Initial Impression / Assessment and Plan / UC Course  I have reviewed the triage vital signs and the nursing notes.  Pertinent labs & imaging results that were available during my care of the patient were reviewed by me and considered in my medical decision making (see chart for details).    XR Hip: Degenerative changes of the lumbar spine and hip joints are noted. Pelvic ring is intact. No acute fracture is noted.   Final Clinical Impressions(s) / UC Diagnoses   Final diagnoses:  None    ED Discharge Orders    None       Controlled Substance Prescriptions [place] Controlled Substance Registry consulted? Not Applicable   Reinaldo Raddle, NP 10/04/17 1430    Atalie Oros, NP 10/04/17 1430

## 2019-08-13 IMAGING — CR DG HIP (WITH OR WITHOUT PELVIS) 2-3V*R*
3 series · 3 of 3 positions shown · non-contrast
Comparison: None.

CLINICAL DATA: Right hip pain for several months

EXAM:
DG HIP (WITH OR WITHOUT PELVIS) 2-3V RIGHT

[pelvis ap]
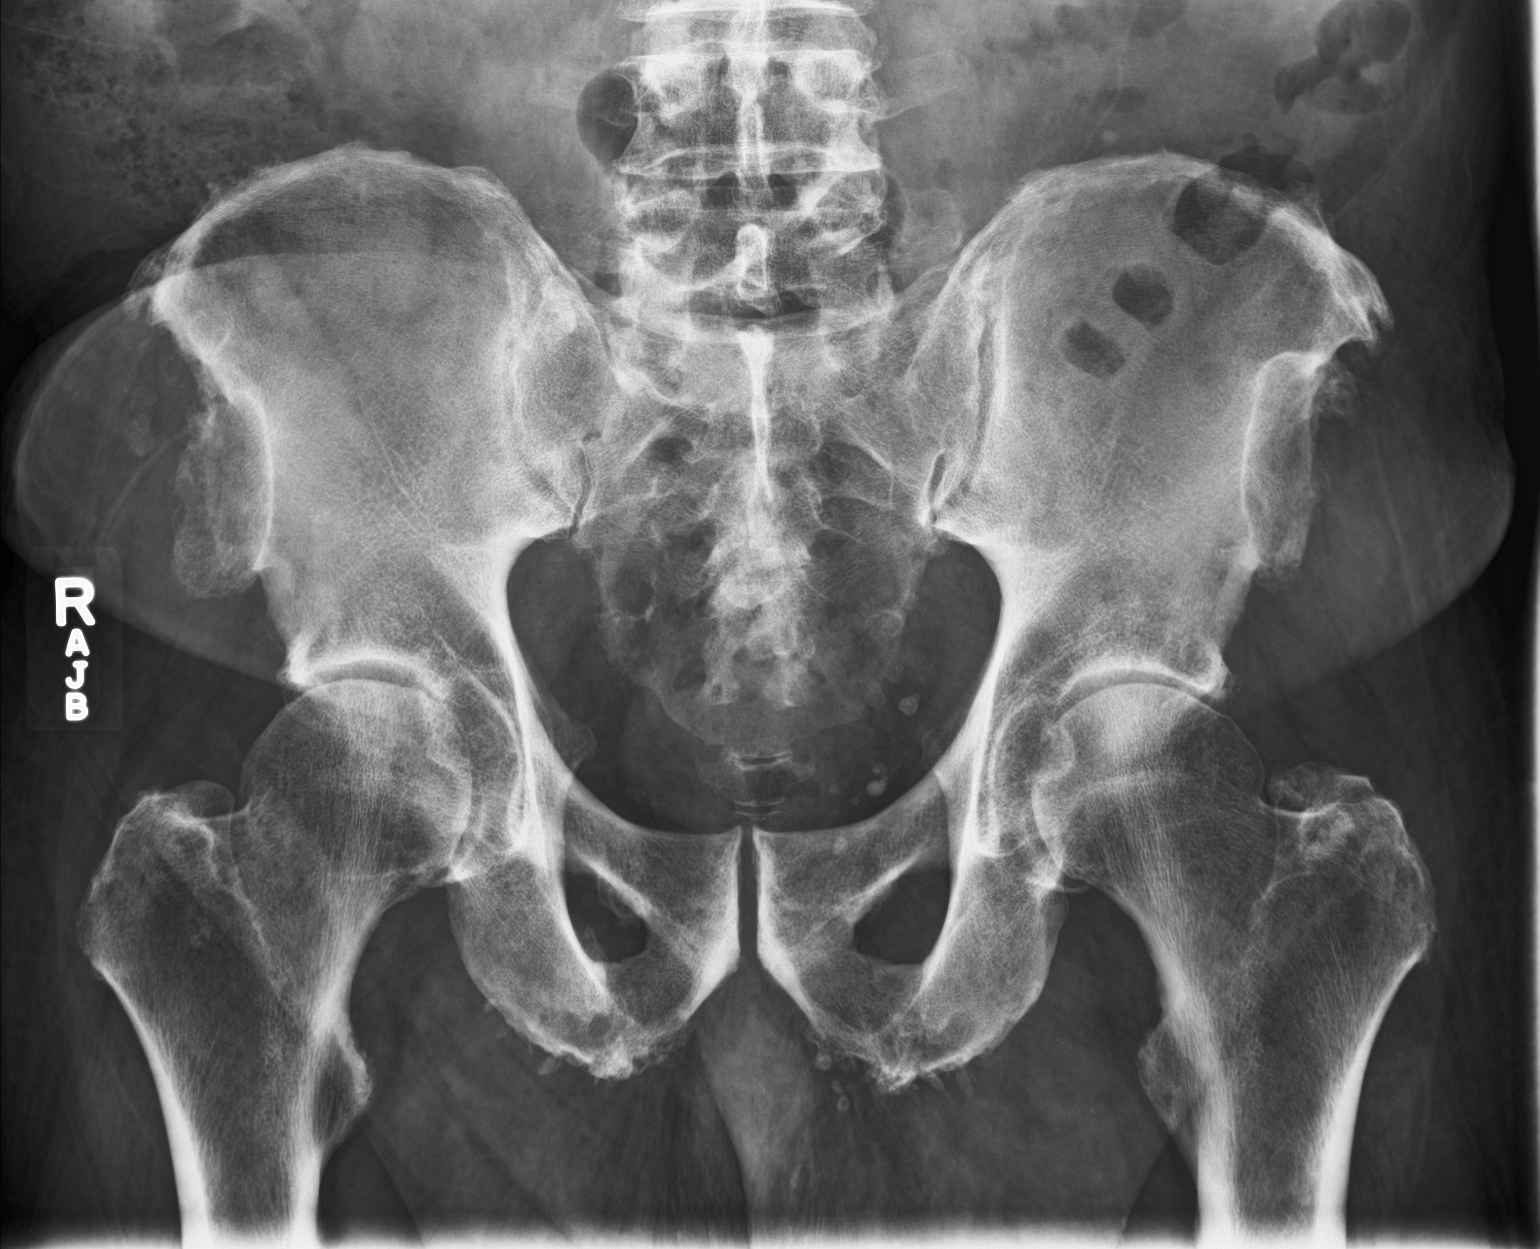

[hip ap]
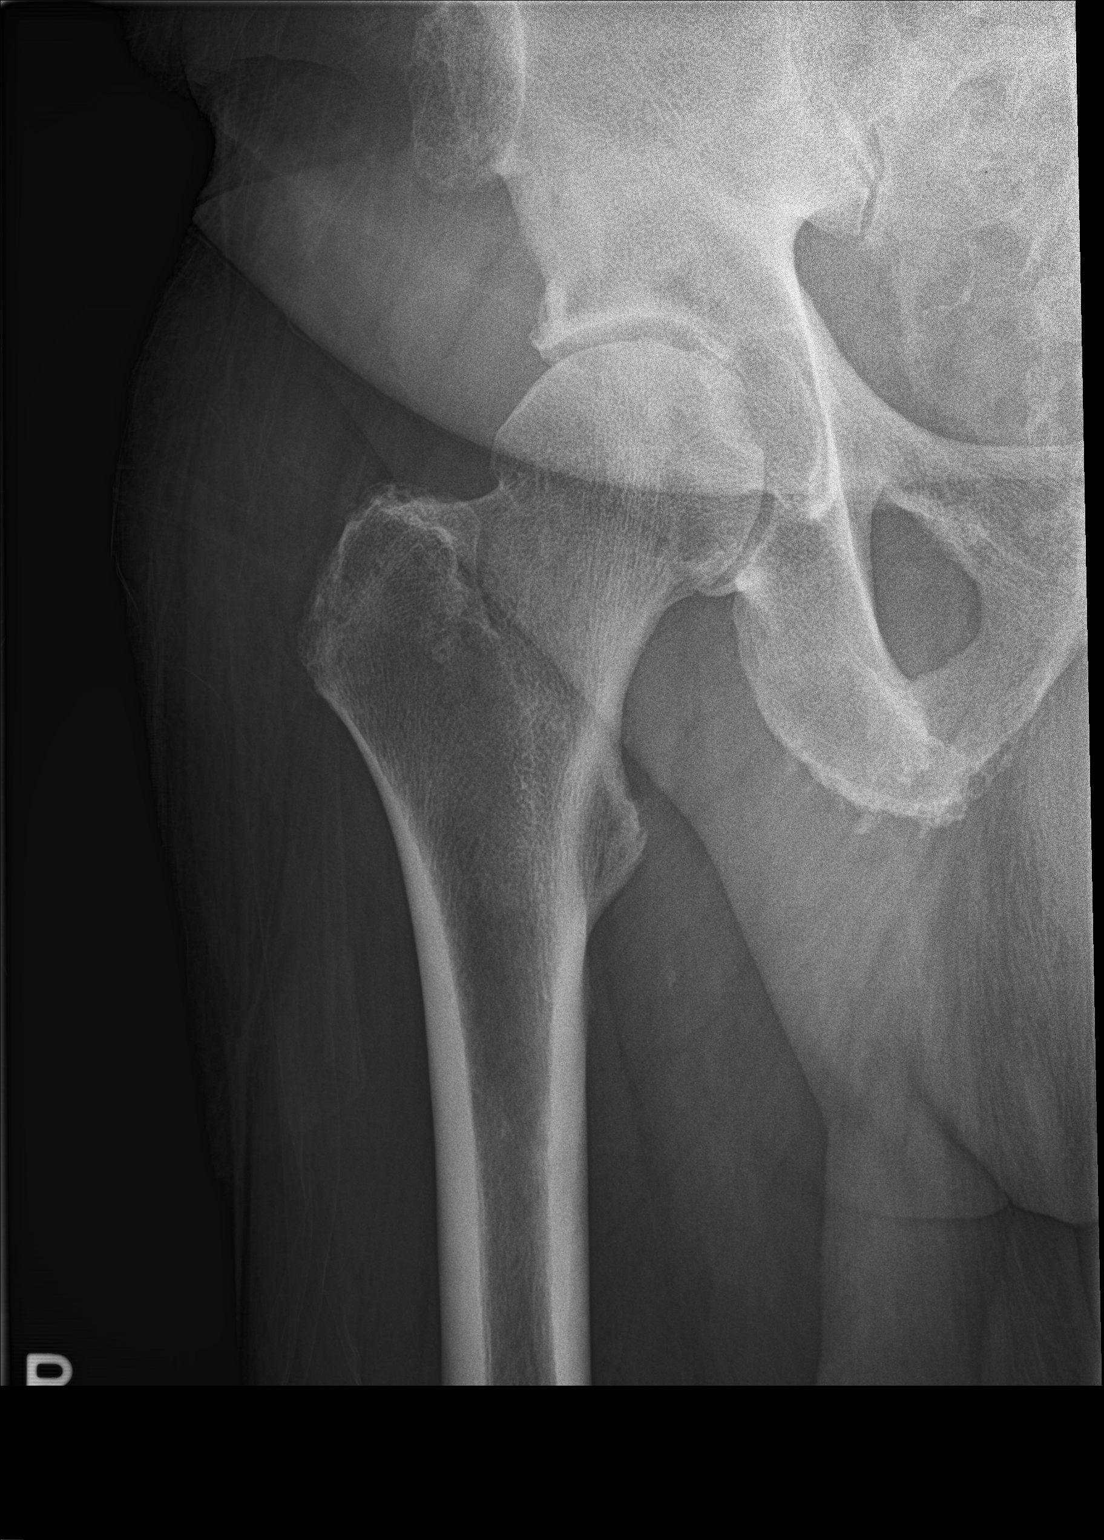

[hip lat]
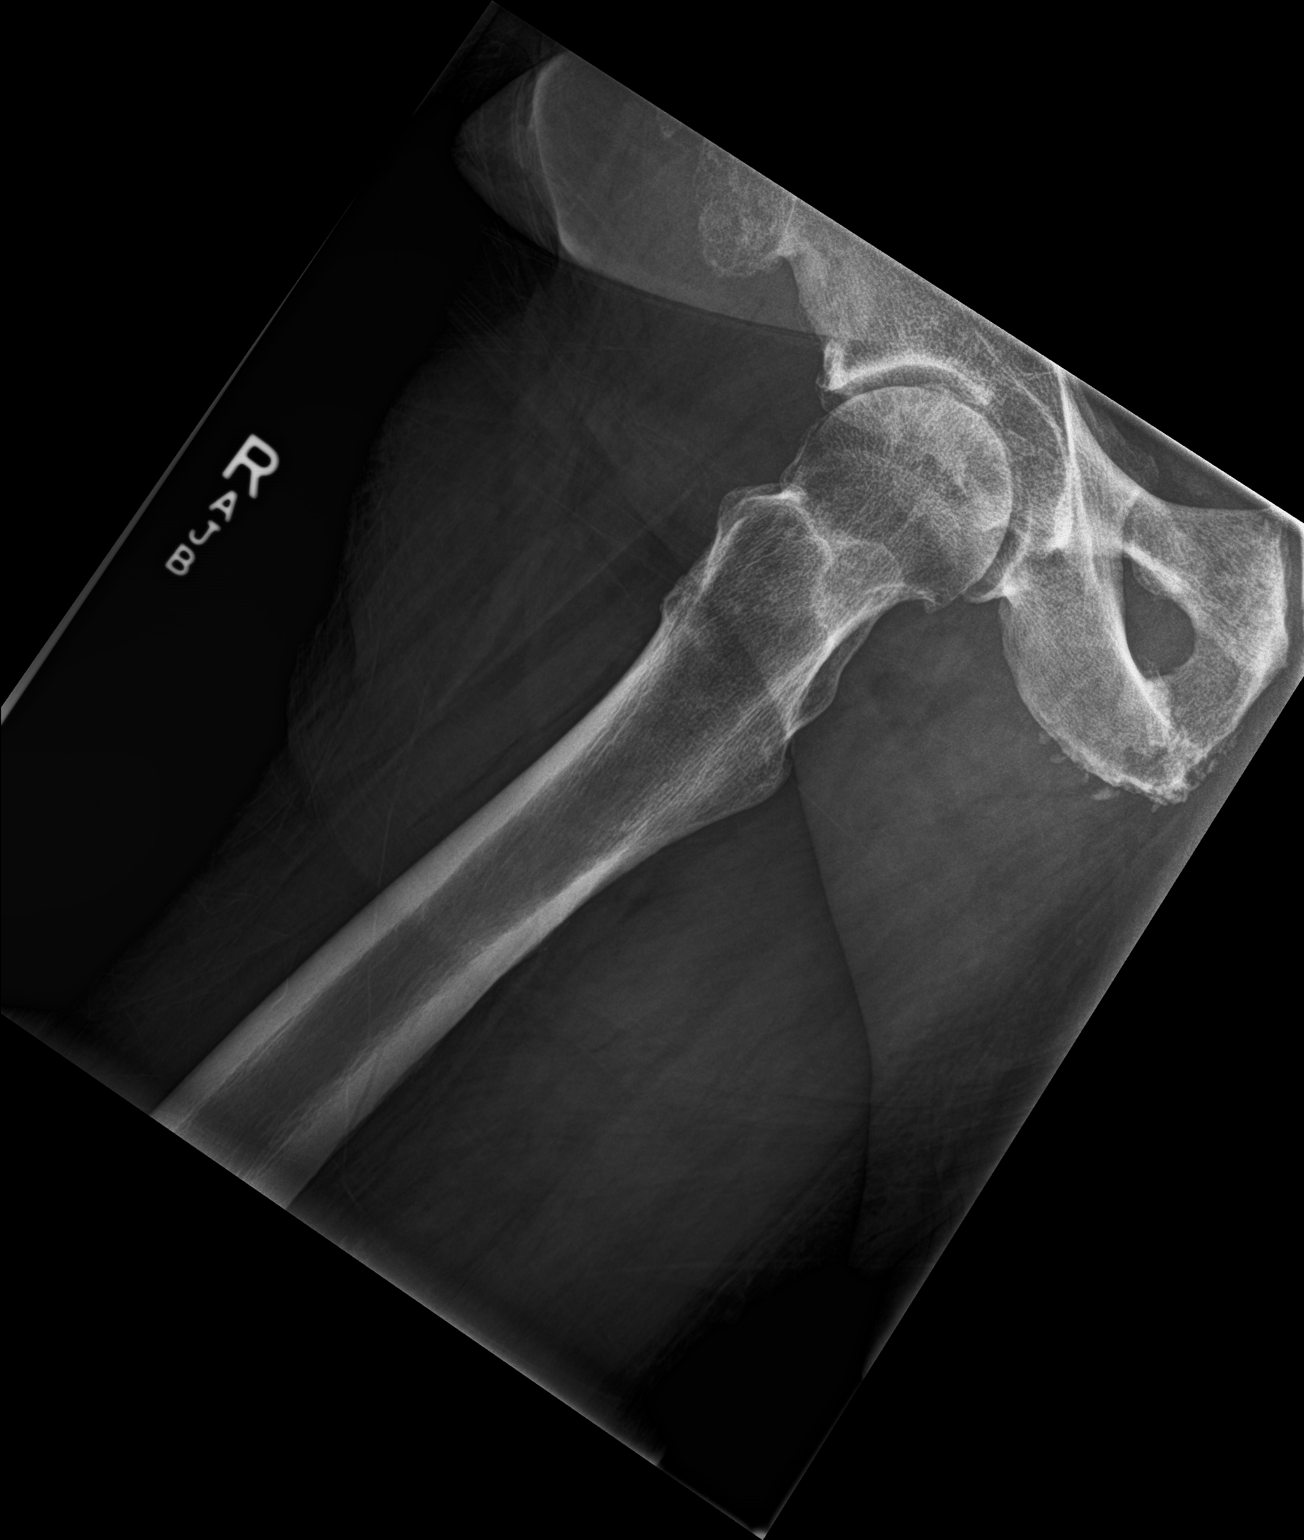

[3 of 3 positions shown; findings below may reference images not displayed]

FINDINGS: Degenerative changes of the lumbar spine and hip joints are noted.
Pelvic ring is intact. No acute fracture is noted. There are changes
noted in the lateral aspect of the iliac crest on the right slightly
more marked than that on the left. This may be developmental in
nature.
IMPRESSION: Changes along the lateral aspect of the right iliac crest slightly
more prominent than that on the left. Although this may be
developmental in nature no prior films are available for comparison.
Correlation to patient's clinical symptomatology is recommended. CT
pelvis may be helpful for further evaluation.

## 2024-03-29 DEATH — deceased
